# Patient Record
Sex: Female | Born: 1950 | Race: White | Hispanic: No | Marital: Married | Smoking: Never smoker
Health system: Southern US, Community
[De-identification: ages and names within clinical notes are randomized; demographics above are authoritative.]

## PROBLEM LIST (undated history)

## (undated) DIAGNOSIS — Z889 Allergy status to unspecified drugs, medicaments and biological substances status: Secondary | ICD-10-CM

## (undated) DIAGNOSIS — IMO0001 Reserved for inherently not codable concepts without codable children: Secondary | ICD-10-CM

## (undated) DIAGNOSIS — G709 Myoneural disorder, unspecified: Secondary | ICD-10-CM

## (undated) DIAGNOSIS — M1990 Unspecified osteoarthritis, unspecified site: Secondary | ICD-10-CM

## (undated) DIAGNOSIS — F419 Anxiety disorder, unspecified: Secondary | ICD-10-CM

## (undated) DIAGNOSIS — N393 Stress incontinence (female) (male): Secondary | ICD-10-CM

## (undated) DIAGNOSIS — K5900 Constipation, unspecified: Secondary | ICD-10-CM

## (undated) DIAGNOSIS — I8390 Asymptomatic varicose veins of unspecified lower extremity: Secondary | ICD-10-CM

## (undated) DIAGNOSIS — R7989 Other specified abnormal findings of blood chemistry: Secondary | ICD-10-CM

## (undated) HISTORY — PX: TUBAL LIGATION: SHX77

## (undated) HISTORY — PX: KNEE ARTHROSCOPY: SHX127

## (undated) HISTORY — PX: APPENDECTOMY: SHX54

## (undated) HISTORY — PX: ADENOIDECTOMY: SUR15

## (undated) HISTORY — PX: TONSILLECTOMY: SUR1361

---

## 2011-08-14 ENCOUNTER — Encounter: Payer: Self-pay | Admitting: *Deleted

## 2011-08-14 ENCOUNTER — Emergency Department (HOSPITAL_COMMUNITY): Payer: Worker's Compensation

## 2011-08-14 ENCOUNTER — Emergency Department (HOSPITAL_COMMUNITY)
Admission: EM | Admit: 2011-08-14 | Discharge: 2011-08-14 | Disposition: A | Discharge status: Home / Self Care | Payer: Worker's Compensation | Attending: Emergency Medicine | Admitting: Emergency Medicine

## 2011-08-14 DIAGNOSIS — M79609 Pain in unspecified limb: Secondary | ICD-10-CM | POA: Insufficient documentation

## 2011-08-14 DIAGNOSIS — S6000XA Contusion of unspecified finger without damage to nail, initial encounter: Secondary | ICD-10-CM | POA: Insufficient documentation

## 2011-08-14 DIAGNOSIS — S63509A Unspecified sprain of unspecified wrist, initial encounter: Secondary | ICD-10-CM | POA: Insufficient documentation

## 2011-08-14 DIAGNOSIS — T07XXXA Unspecified multiple injuries, initial encounter: Secondary | ICD-10-CM

## 2011-08-14 DIAGNOSIS — M25539 Pain in unspecified wrist: Secondary | ICD-10-CM | POA: Insufficient documentation

## 2011-08-14 DIAGNOSIS — IMO0001 Reserved for inherently not codable concepts without codable children: Secondary | ICD-10-CM | POA: Insufficient documentation

## 2011-08-14 DIAGNOSIS — W19XXXA Unspecified fall, initial encounter: Secondary | ICD-10-CM | POA: Insufficient documentation

## 2011-08-14 MED ORDER — HYDROCODONE-ACETAMINOPHEN 5-325 MG PO TABS: 1.0000 | ORAL_TABLET | ORAL | Status: AC | PRN
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
## 2011-08-14 NOTE — ED Provider Notes (Signed)
Medical screening examination/treatment/procedure(s) were performed by non-physician practitioner and as supervising physician I was immediately available for consultation/collaboration.   Dione Booze, MD 08/14/11 [phone]
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
## 2015-02-21 ENCOUNTER — Encounter (HOSPITAL_COMMUNITY): Payer: Self-pay

## 2015-02-21 ENCOUNTER — Encounter (HOSPITAL_COMMUNITY)
Admission: RE | Admit: 2015-02-21 | Discharge: 2015-02-21 | Disposition: A | Discharge status: Home / Self Care | Payer: Worker's Compensation | Source: Ambulatory Visit | Attending: Orthopedic Surgery | Admitting: Orthopedic Surgery

## 2015-02-21 DIAGNOSIS — Z01812 Encounter for preprocedural laboratory examination: Secondary | ICD-10-CM | POA: Insufficient documentation

## 2015-02-21 DIAGNOSIS — M1712 Unilateral primary osteoarthritis, left knee: Secondary | ICD-10-CM | POA: Insufficient documentation

## 2015-02-21 HISTORY — DX: Allergy status to unspecified drugs, medicaments and biological substances: Z88.9

## 2015-02-21 HISTORY — DX: Myoneural disorder, unspecified: G70.9

## 2015-02-21 LAB — CBC
HCT: 41.1 % (ref 36.0–46.0)
HEMOGLOBIN: 13.5 g/dL (ref 12.0–15.0)
MCH: 29.8 pg (ref 26.0–34.0)
MCHC: 32.8 g/dL (ref 30.0–36.0)
MCV: 90.7 fL (ref 78.0–100.0)
PLATELETS: 272 10*3/uL (ref 150–400)
RBC: 4.53 MIL/uL (ref 3.87–5.11)
RDW: 12.6 % (ref 11.5–15.5)
WBC: 6.9 10*3/uL (ref 4.0–10.5)

## 2015-02-21 LAB — URINALYSIS, ROUTINE W REFLEX MICROSCOPIC
BILIRUBIN URINE: NEGATIVE
GLUCOSE, UA: NEGATIVE mg/dL
Ketones, ur: NEGATIVE mg/dL
NITRITE: NEGATIVE
PH: 6 (ref 5.0–8.0)
Protein, ur: NEGATIVE mg/dL
Specific Gravity, Urine: 1.009 (ref 1.005–1.030)
Urobilinogen, UA: 0.2 mg/dL (ref 0.0–1.0)

## 2015-02-21 LAB — URINE MICROSCOPIC-ADD ON

## 2015-02-21 LAB — BASIC METABOLIC PANEL
Anion gap: 5 (ref 5–15)
BUN: 14 mg/dL (ref 6–20)
CHLORIDE: 107 mmol/L (ref 101–111)
CO2: 28 mmol/L (ref 22–32)
Calcium: 8.9 mg/dL (ref 8.9–10.3)
Creatinine, Ser: 0.62 mg/dL (ref 0.44–1.00)
GFR calc non Af Amer: >60 mL/min (ref >60)
GLUCOSE: 103 mg/dL — AB (ref 65–99)
Potassium: 4.4 mmol/L (ref 3.5–5.1)
Sodium: 140 mmol/L (ref 135–145)

## 2015-02-21 LAB — APTT: APTT: 29 s (ref 24–37)

## 2015-02-21 LAB — SURGICAL PCR SCREEN
MRSA, PCR: NEGATIVE
STAPHYLOCOCCUS AUREUS: NEGATIVE

## 2015-02-21 LAB — PROTIME-INR
INR: 0.95 (ref 0.00–1.49)
PROTHROMBIN TIME: 12.9 s (ref 11.6–15.2)

## 2015-02-21 NOTE — Progress Notes (Signed)
02-21-15 1645 Labs viewable in Epic, note urinalysis.

## 2015-02-21 NOTE — Patient Instructions (Signed)
20 Darlene Petty  02/21/2015   Your procedure is scheduled on:   03-03-2015 Monday  [facility] and [facility]. Arrive at       1000 AM .  (Limit 1 person with you).  Call this number if you have problems the morning of surgery: [phone]  Or Presurgical Testing [phone].   For Living Will and/or Health Care Power Attorney Forms: please provide copy for your medical record,may bring AM of surgery(Forms should be already notarized -we do not provide this service).(NO-  information given 02-21-15 as requested).    Do not eat food/ or drink: After Midnight.  Exception: may have clear liquids:up to 6 Hours before arrival. Nothing after: 0700 AM.  Clear liquids include soda, tea, black coffee, apple or grape juice, broth.  Take these medicines the morning of surgery with A SIP OF WATER: Loratadine(if need).   Do not wear jewelry, make-up or nail polish.  Do not wear deodorant, lotions, powders, or perfumes.   Do not shave legs and under arms- 48 hours(2 days) prior to first CHG shower.(Shaving face and neck okay.)  Do not bring valuables to the hospital.(Hospital is not responsible for lost valuables).  Contacts, dentures or removable bridgework, body piercing, hair pins may not be worn into surgery.  Leave suitcase in the car. After surgery it may be brought to your room.  For patients admitted to the hospital, checkout time is 11:00 AM the day of discharge.(Restricted visitors-Any Persons displaying flu-like symptoms or illness).    Patients discharged the day of surgery will not be allowed to drive home. Must have responsible person with you x 24 hours once discharged.  Name and phone number of your driver: Darlene MinisterDennis- spouse [phone] .      Please read over the following fact sheets that you were given:  CHG(Chlorhexidine Gluconate 4% Surgical Soap) use, MRSA Information, Blood Transfusion fact sheet, Incentive Spirometry  Instruction.  Remember : Type/Screen "Blue armbands" - may not be removed once applied(would result in being retested AM of surgery, if removed).         [place] - Preparing for Surgery Before surgery, you can play an important role.  Because skin is not sterile, your skin needs to be as free of germs as possible.  You can reduce the number of germs on your skin by washing with CHG (chlorahexidine gluconate) soap before surgery.  CHG is an antiseptic cleaner which kills germs and bonds with the skin to continue killing germs even after washing. Please DO NOT use if you have an allergy to CHG or antibacterial soaps.  If your skin becomes reddened/irritated stop using the CHG and inform your nurse when you arrive at Short Stay. Do not shave (including legs and underarms) for at least 48 hours prior to the first CHG shower.  You may shave your face/neck. Please follow these instructions carefully:  1.  Shower with CHG Soap the night before surgery and the  morning of Surgery.  2.  If you choose to wash your hair, wash your hair first as usual with your  normal  shampoo.  3.  After you shampoo, rinse your hair and body thoroughly to remove the  shampoo.                           4.  Use CHG as you would any other liquid soap.  You can apply  chg directly  to the skin and wash                       Gently with a scrungie or clean washcloth.  5.  Apply the CHG Soap to your body ONLY FROM THE NECK DOWN.   Do not use on face/ open                           Wound or open sores. Avoid contact with eyes, ears mouth and genitals (private parts).                       Wash face,  Genitals (private parts) with your normal soap.             6.  Wash thoroughly, paying special attention to the area where your surgery  will be performed.  7.  Thoroughly rinse your body with warm water from the neck down.  8.  DO NOT shower/wash with your normal soap after using and rinsing off  the CHG Soap.                 9.  Pat yourself dry with a clean towel.            10.  Wear clean pajamas.            11.  Place clean sheets on your bed the night of your first shower and do not  sleep with pets. Day of Surgery : Do not apply any lotions/deodorants the morning of surgery.  Please wear clean clothes to the hospital/surgery center.  FAILURE TO FOLLOW THESE INSTRUCTIONS MAY RESULT IN THE CANCELLATION OF YOUR SURGERY PATIENT SIGNATURE_________________________________  NURSE SIGNATURE__________________________________  ________________________________________________________________________   Darlene Petty  An incentive spirometer is a tool that can help keep your lungs clear and active. This tool measures how well you are filling your lungs with each breath. Taking long deep breaths may help reverse or decrease the chance of developing breathing (pulmonary) problems (especially infection) following:  A long period of time when you are unable to move or be active. BEFORE THE PROCEDURE   If the spirometer includes an indicator to show your best effort, your nurse or respiratory therapist will set it to a desired goal.  If possible, sit up straight or lean slightly forward. Try not to slouch.  Hold the incentive spirometer in an upright position. INSTRUCTIONS FOR USE   Sit on the edge of your bed if possible, or sit up as far as you can in bed or on a chair.  Hold the incentive spirometer in an upright position.  Breathe out normally.  Place the mouthpiece in your mouth and seal your lips tightly around it.  Breathe in slowly and as deeply as possible, raising the piston or the ball toward the top of the column.  Hold your breath for 3-5 seconds or for as long as possible. Allow the piston or ball to fall to the bottom of the column.  Remove the mouthpiece from your mouth and breathe out normally.  Rest for a few seconds and repeat Steps 1 through 7 at least 10 times every 1-2 hours  when you are awake. Take your time and take a few normal breaths between deep breaths.  The spirometer may include an indicator to show your best effort. Use the indicator as a goal to work toward during each  repetition.  After each set of 10 deep breaths, practice coughing to be sure your lungs are clear. If you have an incision (the cut made at the time of surgery), support your incision when coughing by placing a pillow or rolled up towels firmly against it. Once you are able to get out of bed, walk around indoors and cough well. You may stop using the incentive spirometer when instructed by your caregiver.  RISKS AND COMPLICATIONS  Take your time so you do not get dizzy or light-headed.  If you are in pain, you may need to take or ask for pain medication before doing incentive spirometry. It is harder to take a deep breath if you are having pain. AFTER USE  Rest and breathe slowly and easily.  It can be helpful to keep track of a log of your progress. Your caregiver can provide you with a simple table to help with this. If you are using the spirometer at home, follow these instructions: [place] IF:   You are having difficultly using the spirometer.  You have trouble using the spirometer as often as instructed.  Your pain medication is not giving enough relief while using the spirometer.  You develop fever of 100.5 F (38.1 C) or higher. SEEK IMMEDIATE MEDICAL CARE IF:   You cough up bloody sputum that had not been present before.  You develop fever of 102 F (38.9 C) or greater.  You develop worsening pain at or near the incision site. MAKE SURE YOU:   Understand these instructions.  Will watch your condition.  Will get help right away if you are not doing well or get worse. Document Released: 12/06/2006 Document Revised: 10/18/2011 Document Reviewed: 02/06/2007 Exit[employer] Patient Information 2014 Exit[employer],  [place].   ________________________________________________________________________  WHAT IS A BLOOD TRANSFUSION? Blood Transfusion Information  A transfusion is the replacement of blood or some of its parts. Blood is made up of multiple cells which provide different functions.  Red blood cells carry oxygen and are used for blood loss replacement.  White blood cells fight against infection.  Platelets control bleeding.  Plasma helps clot blood.  Other blood products are available for specialized needs, such as hemophilia or other clotting disorders. BEFORE THE TRANSFUSION  Who gives blood for transfusions?   Healthy volunteers who are fully evaluated to make sure their blood is safe. This is blood bank blood. Transfusion therapy is the safest it has ever been in the practice of medicine. Before blood is taken from a donor, a complete history is taken to make sure that person has no history of diseases nor engages in risky social behavior (examples are intravenous drug use or sexual activity with multiple partners). The donor's travel history is screened to minimize risk of transmitting infections, such as malaria. The donated blood is tested for signs of infectious diseases, such as HIV and hepatitis. The blood is then tested to be sure it is compatible with you in order to minimize the chance of a transfusion reaction. If you or a relative donates blood, this is often done in anticipation of surgery and is not appropriate for emergency situations. It takes many days to process the donated blood. RISKS AND COMPLICATIONS Although transfusion therapy is very safe and saves many lives, the main dangers of transfusion include:   Getting an infectious disease.  Developing a transfusion reaction. This is an allergic reaction to something in the blood you were given. Every precaution is taken to prevent this. The  decision to have a blood transfusion has been considered carefully by your caregiver  before blood is given. Blood is not given unless the benefits outweigh the risks. AFTER THE TRANSFUSION  Right after receiving a blood transfusion, you will usually feel much better and more energetic. This is especially true if your red blood cells have gotten low (anemic). The transfusion raises the level of the red blood cells which carry oxygen, and this usually causes an energy increase.  The nurse administering the transfusion will monitor you carefully for complications. HOME CARE INSTRUCTIONS  No special instructions are needed after a transfusion. You may find your energy is better. Speak with your caregiver about any limitations on activity for underlying diseases you may have. SEEK MEDICAL CARE IF:   Your condition is not improving after your transfusion.  You develop redness or irritation at the intravenous (IV) site. SEEK IMMEDIATE MEDICAL CARE IF:  Any of the following symptoms occur over the next 12 hours:  Shaking chills.  You have a temperature by mouth above 102 F (38.9 C), not controlled by medicine.  Chest, back, or muscle pain.  People around you feel you are not acting correctly or are confused.  Shortness of breath or difficulty breathing.  Dizziness and fainting.  You get a rash or develop hives.  You have a decrease in urine output.  Your urine turns a dark color or changes to pink, red, or brown. Any of the following symptoms occur over the next 10 days:  You have a temperature by mouth above 102 F (38.9 C), not controlled by medicine.  Shortness of breath.  Weakness after normal activity.  The white part of the eye turns yellow (jaundice).  You have a decrease in the amount of urine or are urinating less often.  Your urine turns a dark color or changes to pink, red, or brown. Document Released: 07/23/2000 Document Revised: 10/18/2011 Document Reviewed: 03/11/2008 [facility] Patient Information 2014 [place],  [place].  _______________________________________________________________________

## 2015-02-21 NOTE — Pre-Procedure Instructions (Signed)
02-21-15 1645 [place], note to office fax [phone] to note urinalysis.

## 2015-02-28 NOTE — H&P (Signed)
TOTAL KNEE ADMISSION H&P  Patient is being admitted for left total knee arthroplasty.  Subjective:  Chief Complaint:   Left knee primary OA / pain.  HPI: Darlene Petty, 64 y.o. female, has a history of pain and functional disability in the left knee due to arthritis and has failed non-surgical conservative treatments for greater than 12 weeks to includeNSAID's and/or analgesics, corticosteriod injections, viscosupplementation injections, use of assistive devices and activity modification.  Onset of symptoms was gradual, starting 10 years ago with gradually worsening course since that time. The patient noted prior procedures on the knee to include  arthroscopy on the left knee(s).  Patient currently rates pain in the left knee(s) at 10 out of 10 with activity. Patient has night pain, worsening of pain with activity and weight bearing, pain that interferes with activities of daily living, pain with passive range of motion, crepitus and joint swelling.  Patient has evidence of periarticular osteophytes and joint space narrowing by imaging studies.  There is no active infection.  Risks, benefits and expectations were discussed with the patient.  Risks including but not limited to the risk of anesthesia, blood clots, nerve damage, blood vessel damage, failure of the prosthesis, infection and up to and including death.  Patient understand the risks, benefits and expectations and wishes to proceed with surgery.   PCP: Pcp Not In System  D/C Plans:      Home with HHPT  Post-op Meds:       No Rx given  Tranexamic Acid:      To be given - IV   Decadron:      Is to be given  FYI:     ASA post-op  Norco post-op    Past Medical History  Diagnosis Date  . Neuromuscular disorder     numbness fingers/hands- bilaterally -right worse than left  . H/O seasonal allergies     Past Surgical History  Procedure Laterality Date  . Tubal ligation    . Appendectomy    . Tonsillectomy    . Knee arthroscopy       left    No prescriptions prior to admission   No Known Allergies   History  Substance Use Topics  . Smoking status: Never Smoker   . Smokeless tobacco: Not on file  . Alcohol Use: No       Review of Systems  Constitutional: Negative.   HENT: Negative.   Eyes: Negative.   Respiratory: Negative.   Cardiovascular: Negative.   Gastrointestinal: Negative.   Genitourinary: Negative.   Musculoskeletal: Positive for joint pain.  Skin: Negative.   Neurological: Negative.   Endo/Heme/Allergies: Negative.   Psychiatric/Behavioral: The patient is nervous/anxious.     Objective:  Physical Exam  Constitutional: She is oriented to person, place, and time. She appears well-developed and well-nourished.  HENT:  Head: Normocephalic and atraumatic.  Eyes: Pupils are equal, round, and reactive to light.  Neck: Neck supple. No JVD present. No tracheal deviation present. No thyromegaly present.  Cardiovascular: Normal rate, regular rhythm, normal heart sounds and intact distal pulses.   Respiratory: Effort normal and breath sounds normal. No stridor. No respiratory distress. She has no wheezes.  GI: Soft. There is no tenderness. There is no guarding.  Musculoskeletal:       Left knee: She exhibits decreased range of motion, swelling, abnormal alignment and bony tenderness. She exhibits no ecchymosis, no deformity, no laceration and no erythema. Tenderness found.  Lymphadenopathy:    She has no cervical  adenopathy.  Neurological: She is alert and oriented to person, place, and time. A sensory deficit (bilateral LE neuropathy) is present.  Skin: Skin is warm and dry.  Psychiatric: She has a normal mood and affect.      Labs:  Estimated body mass index is 30.12 kg/(m^2) as calculated from the following:   Height as of 08/14/11:  (1.6 m).   Weight as of 08/14/11: 77.111 kg (170 lb).   Imaging Review Plain radiographs demonstrate severe degenerative joint disease of the left  knee(s). The overall alignment is mild varus. The bone quality appears to be good for age and reported activity level.  Assessment/Plan:  End stage arthritis, left knee   The patient history, physical examination, clinical judgment of the provider and imaging studies are consistent with end stage degenerative joint disease of the left knee(s) and total knee arthroplasty is deemed medically necessary. The treatment options including medical management, injection therapy arthroscopy and arthroplasty were discussed at length. The risks and benefits of total knee arthroplasty were presented and reviewed. The risks due to aseptic loosening, infection, stiffness, patella tracking problems, thromboembolic complications and other imponderables were discussed. The patient acknowledged the explanation, agreed to proceed with the plan and consent was signed. Patient is being admitted for inpatient treatment for surgery, pain control, PT, OT, prophylactic antibiotics, VTE prophylaxis, progressive ambulation and ADL's and discharge planning. The patient is planning to be discharged home with home health services.      Anastasio Auerbach Shelbee Apgar   PA-C  02/28/2015, 9:00 AM

## 2015-03-03 ENCOUNTER — Inpatient Hospital Stay (HOSPITAL_COMMUNITY): Payer: Worker's Compensation | Admitting: Anesthesiology

## 2015-03-03 ENCOUNTER — Inpatient Hospital Stay (HOSPITAL_COMMUNITY)
Admission: RE | Admit: 2015-03-03 | Discharge: 2015-03-04 | DRG: 470 | Disposition: A | Discharge status: Home / Self Care | Payer: Worker's Compensation | Source: Ambulatory Visit | Attending: Orthopedic Surgery | Admitting: Orthopedic Surgery

## 2015-03-03 ENCOUNTER — Encounter (HOSPITAL_COMMUNITY): Payer: Self-pay | Admitting: *Deleted

## 2015-03-03 ENCOUNTER — Encounter (HOSPITAL_COMMUNITY)
Admission: RE | Disposition: A | Discharge status: Home / Self Care | Payer: Self-pay | Source: Ambulatory Visit | Attending: Orthopedic Surgery

## 2015-03-03 DIAGNOSIS — Z9049 Acquired absence of other specified parts of digestive tract: Secondary | ICD-10-CM | POA: Diagnosis present

## 2015-03-03 DIAGNOSIS — G709 Myoneural disorder, unspecified: Secondary | ICD-10-CM | POA: Diagnosis present

## 2015-03-03 DIAGNOSIS — M659 Synovitis and tenosynovitis, unspecified: Secondary | ICD-10-CM | POA: Diagnosis present

## 2015-03-03 DIAGNOSIS — Z96652 Presence of left artificial knee joint: Secondary | ICD-10-CM

## 2015-03-03 DIAGNOSIS — E669 Obesity, unspecified: Secondary | ICD-10-CM | POA: Diagnosis present

## 2015-03-03 DIAGNOSIS — Z96659 Presence of unspecified artificial knee joint: Secondary | ICD-10-CM

## 2015-03-03 DIAGNOSIS — M1712 Unilateral primary osteoarthritis, left knee: Secondary | ICD-10-CM | POA: Diagnosis present

## 2015-03-03 DIAGNOSIS — Z6830 Body mass index (BMI) 30.0-30.9, adult: Secondary | ICD-10-CM

## 2015-03-03 HISTORY — PX: TOTAL KNEE ARTHROPLASTY: SHX125

## 2015-03-03 LAB — TYPE AND SCREEN
ABO/RH(D): A POS
ANTIBODY SCREEN: NEGATIVE

## 2015-03-03 LAB — ABO/RH: ABO/RH(D): A POS

## 2015-03-03 SURGERY — ARTHROPLASTY, KNEE, TOTAL
Anesthesia: Spinal | Site: Knee | Laterality: Left

## 2015-03-03 MED ORDER — DOCUSATE SODIUM 100 MG PO CAPS
100.0000 mg | ORAL_CAPSULE | Freq: Two times a day (BID) | ORAL | Status: DC
  Administered 2015-03-03 – 2015-03-04 (×2): 100 mg via ORAL

## 2015-03-03 MED ORDER — METOCLOPRAMIDE HCL 5 MG/ML IJ SOLN: 5.0000 mg | Freq: Three times a day (TID) | INTRAMUSCULAR | Status: DC | PRN

## 2015-03-03 MED ORDER — METOCLOPRAMIDE HCL 10 MG PO TABS: 5.0000 mg | ORAL_TABLET | Freq: Three times a day (TID) | ORAL | Status: DC | PRN

## 2015-03-03 MED ORDER — PHENOL 1.4 % MT LIQD
1.0000 | OROMUCOSAL | Status: DC | PRN
  Filled 2015-03-03: qty 177

## 2015-03-03 MED ORDER — KETOROLAC TROMETHAMINE 30 MG/ML IJ SOLN
INTRAMUSCULAR | Status: AC
  Filled 2015-03-03: qty 1

## 2015-03-03 MED ORDER — MAGNESIUM CITRATE PO SOLN: 1.0000 | Freq: Once | ORAL | Status: AC | PRN

## 2015-03-03 MED ORDER — FERROUS SULFATE 325 (65 FE) MG PO TABS
325.0000 mg | ORAL_TABLET | Freq: Three times a day (TID) | ORAL | Status: DC
  Administered 2015-03-03 – 2015-03-04 (×3): 325 mg via ORAL
  Filled 2015-03-03 (×5): qty 1

## 2015-03-03 MED ORDER — ONDANSETRON HCL 4 MG/2ML IJ SOLN
INTRAMUSCULAR | Status: DC | PRN
  Administered 2015-03-03: 4 mg via INTRAVENOUS

## 2015-03-03 MED ORDER — HYDROMORPHONE HCL 1 MG/ML IJ SOLN
0.5000 mg | INTRAMUSCULAR | Status: DC | PRN
  Administered 2015-03-03: 0.5 mg via INTRAVENOUS
  Administered 2015-03-03: 1 mg via INTRAVENOUS
  Administered 2015-03-04: 0.5 mg via INTRAVENOUS
  Filled 2015-03-03 (×3): qty 1

## 2015-03-03 MED ORDER — DIPHENHYDRAMINE HCL 25 MG PO CAPS: 25.0000 mg | ORAL_CAPSULE | Freq: Four times a day (QID) | ORAL | Status: DC | PRN

## 2015-03-03 MED ORDER — PHENYLEPHRINE 40 MCG/ML (10ML) SYRINGE FOR IV PUSH (FOR BLOOD PRESSURE SUPPORT)
PREFILLED_SYRINGE | INTRAVENOUS | Status: AC
  Filled 2015-03-03: qty 10

## 2015-03-03 MED ORDER — PHENYLEPHRINE HCL 10 MG/ML IJ SOLN
INTRAMUSCULAR | Status: AC
  Filled 2015-03-03: qty 1

## 2015-03-03 MED ORDER — PROPOFOL INFUSION 10 MG/ML OPTIME
INTRAVENOUS | Status: DC | PRN
  Administered 2015-03-03: 100 ug/kg/min via INTRAVENOUS

## 2015-03-03 MED ORDER — LACTATED RINGERS IV SOLN
INTRAVENOUS | Status: DC
  Administered 2015-03-03 (×2): via INTRAVENOUS
  Administered 2015-03-03: 1000 mL via INTRAVENOUS

## 2015-03-03 MED ORDER — MIDAZOLAM HCL 2 MG/2ML IJ SOLN
INTRAMUSCULAR | Status: AC
  Filled 2015-03-03: qty 2

## 2015-03-03 MED ORDER — LORATADINE 10 MG PO TABS
10.0000 mg | ORAL_TABLET | Freq: Every day | ORAL | Status: DC | PRN
  Filled 2015-03-03: qty 1

## 2015-03-03 MED ORDER — CEFAZOLIN SODIUM-DEXTROSE 2-3 GM-% IV SOLR
INTRAVENOUS | Status: AC
  Filled 2015-03-03: qty 50

## 2015-03-03 MED ORDER — BISACODYL 10 MG RE SUPP: 10.0000 mg | Freq: Every day | RECTAL | Status: DC | PRN

## 2015-03-03 MED ORDER — FENTANYL CITRATE (PF) 100 MCG/2ML IJ SOLN
INTRAMUSCULAR | Status: AC
  Filled 2015-03-03: qty 2

## 2015-03-03 MED ORDER — CEFAZOLIN SODIUM-DEXTROSE 2-3 GM-% IV SOLR
2.0000 g | Freq: Four times a day (QID) | INTRAVENOUS | Status: AC
  Administered 2015-03-03 – 2015-03-04 (×2): 2 g via INTRAVENOUS
  Filled 2015-03-03 (×2): qty 50

## 2015-03-03 MED ORDER — CELECOXIB 200 MG PO CAPS
200.0000 mg | ORAL_CAPSULE | Freq: Two times a day (BID) | ORAL | Status: DC
  Administered 2015-03-03 – 2015-03-04 (×2): 200 mg via ORAL
  Filled 2015-03-03 (×3): qty 1

## 2015-03-03 MED ORDER — PHENYLEPHRINE HCL 10 MG/ML IJ SOLN
10.0000 mg | INTRAMUSCULAR | Status: DC | PRN
  Administered 2015-03-03: 10 ug/min via INTRAVENOUS
  Administered 2015-03-03: 20 ug/min via INTRAVENOUS

## 2015-03-03 MED ORDER — SODIUM CHLORIDE 0.9 % IV SOLN
INTRAVENOUS | Status: DC
  Administered 2015-03-03: 18:00:00 via INTRAVENOUS
  Filled 2015-03-03 (×4): qty 1000

## 2015-03-03 MED ORDER — HYDROCODONE-ACETAMINOPHEN 7.5-325 MG PO TABS
1.0000 | ORAL_TABLET | ORAL | Status: DC
  Administered 2015-03-03 – 2015-03-04 (×6): 2 via ORAL
  Filled 2015-03-03 (×6): qty 2

## 2015-03-03 MED ORDER — KETOROLAC TROMETHAMINE 30 MG/ML IJ SOLN
INTRAMUSCULAR | Status: DC | PRN
  Administered 2015-03-03: 30 mg

## 2015-03-03 MED ORDER — TRANEXAMIC ACID 1000 MG/10ML IV SOLN
1000.0000 mg | Freq: Once | INTRAVENOUS | Status: AC
  Administered 2015-03-03: 1000 mg via INTRAVENOUS
  Filled 2015-03-03: qty 10

## 2015-03-03 MED ORDER — ACETAMINOPHEN 325 MG PO TABS
ORAL_TABLET | ORAL | Status: AC
  Filled 2015-03-03: qty 2

## 2015-03-03 MED ORDER — PHENYLEPHRINE HCL 10 MG/ML IJ SOLN
INTRAMUSCULAR | Status: DC | PRN
  Administered 2015-03-03 (×7): 80 ug via INTRAVENOUS

## 2015-03-03 MED ORDER — SODIUM CHLORIDE 0.9 % IJ SOLN
INTRAMUSCULAR | Status: DC | PRN
  Administered 2015-03-03: 29 mL

## 2015-03-03 MED ORDER — ONDANSETRON HCL 4 MG PO TABS: 4.0000 mg | ORAL_TABLET | Freq: Four times a day (QID) | ORAL | Status: DC | PRN

## 2015-03-03 MED ORDER — PROPOFOL 10 MG/ML IV BOLUS
INTRAVENOUS | Status: AC
  Filled 2015-03-03: qty 20

## 2015-03-03 MED ORDER — BUPIVACAINE-EPINEPHRINE (PF) 0.25% -1:200000 IJ SOLN
INTRAMUSCULAR | Status: DC | PRN
  Administered 2015-03-03: 30 mL

## 2015-03-03 MED ORDER — METHOCARBAMOL 500 MG PO TABS
500.0000 mg | ORAL_TABLET | Freq: Four times a day (QID) | ORAL | Status: DC | PRN
  Administered 2015-03-04 (×2): 500 mg via ORAL
  Filled 2015-03-03 (×2): qty 1

## 2015-03-03 MED ORDER — BUPIVACAINE-EPINEPHRINE (PF) 0.25% -1:200000 IJ SOLN
INTRAMUSCULAR | Status: AC
  Filled 2015-03-03: qty 30

## 2015-03-03 MED ORDER — HYDROMORPHONE HCL 1 MG/ML IJ SOLN: 0.2500 mg | INTRAMUSCULAR | Status: DC | PRN

## 2015-03-03 MED ORDER — DEXAMETHASONE SODIUM PHOSPHATE 10 MG/ML IJ SOLN
10.0000 mg | Freq: Once | INTRAMUSCULAR | Status: AC
  Administered 2015-03-03: 10 mg via INTRAVENOUS

## 2015-03-03 MED ORDER — MENTHOL 3 MG MT LOZG: 1.0000 | LOZENGE | OROMUCOSAL | Status: DC | PRN

## 2015-03-03 MED ORDER — ASPIRIN EC 325 MG PO TBEC
325.0000 mg | DELAYED_RELEASE_TABLET | Freq: Two times a day (BID) | ORAL | Status: DC
  Administered 2015-03-04: 325 mg via ORAL
  Filled 2015-03-03 (×3): qty 1

## 2015-03-03 MED ORDER — FENTANYL CITRATE (PF) 250 MCG/5ML IJ SOLN
INTRAMUSCULAR | Status: DC | PRN
  Administered 2015-03-03 (×2): 50 ug via INTRAVENOUS

## 2015-03-03 MED ORDER — DEXAMETHASONE SODIUM PHOSPHATE 10 MG/ML IJ SOLN
INTRAMUSCULAR | Status: AC
  Filled 2015-03-03: qty 1

## 2015-03-03 MED ORDER — ONDANSETRON HCL 4 MG/2ML IJ SOLN
INTRAMUSCULAR | Status: AC
  Filled 2015-03-03: qty 2

## 2015-03-03 MED ORDER — DEXAMETHASONE SODIUM PHOSPHATE 10 MG/ML IJ SOLN
10.0000 mg | Freq: Once | INTRAMUSCULAR | Status: DC
  Filled 2015-03-03: qty 1

## 2015-03-03 MED ORDER — ACETAMINOPHEN 325 MG PO TABS
650.0000 mg | ORAL_TABLET | Freq: Once | ORAL | Status: AC
  Administered 2015-03-03: 650 mg via ORAL

## 2015-03-03 MED ORDER — ALUM & MAG HYDROXIDE-SIMETH 200-200-20 MG/5ML PO SUSP: 30.0000 mL | ORAL | Status: DC | PRN

## 2015-03-03 MED ORDER — SODIUM CHLORIDE 0.9 % IJ SOLN
INTRAMUSCULAR | Status: AC
  Filled 2015-03-03: qty 50

## 2015-03-03 MED ORDER — ONDANSETRON HCL 4 MG/2ML IJ SOLN
4.0000 mg | Freq: Four times a day (QID) | INTRAMUSCULAR | Status: DC | PRN
  Administered 2015-03-04: 4 mg via INTRAVENOUS
  Filled 2015-03-03: qty 2

## 2015-03-03 MED ORDER — POLYETHYLENE GLYCOL 3350 17 G PO PACK
17.0000 g | PACK | Freq: Two times a day (BID) | ORAL | Status: DC
  Administered 2015-03-03 – 2015-03-04 (×2): 17 g via ORAL

## 2015-03-03 MED ORDER — CEFAZOLIN SODIUM-DEXTROSE 2-3 GM-% IV SOLR
2.0000 g | INTRAVENOUS | Status: AC
  Administered 2015-03-03: 2 g via INTRAVENOUS

## 2015-03-03 MED ORDER — CHLORHEXIDINE GLUCONATE 4 % EX LIQD: 60.0000 mL | Freq: Once | CUTANEOUS | Status: DC

## 2015-03-03 MED ORDER — METHOCARBAMOL 1000 MG/10ML IJ SOLN
500.0000 mg | Freq: Four times a day (QID) | INTRAVENOUS | Status: DC | PRN
  Filled 2015-03-03: qty 5

## 2015-03-03 MED ORDER — MIDAZOLAM HCL 5 MG/5ML IJ SOLN
INTRAMUSCULAR | Status: DC | PRN
  Administered 2015-03-03 (×2): 1 mg via INTRAVENOUS

## 2015-03-03 MED ORDER — 0.9 % SODIUM CHLORIDE (POUR BTL) OPTIME
TOPICAL | Status: DC | PRN
  Administered 2015-03-03: 1000 mL

## 2015-03-03 MED ORDER — SODIUM CHLORIDE 0.9 % IR SOLN
Status: DC | PRN
  Administered 2015-03-03: 1000 mL

## 2015-03-03 SURGICAL SUPPLY — 56 items
BAG ZIPLOCK 12X15 (MISCELLANEOUS) IMPLANT
BANDAGE ELASTIC 6 VELCRO ST LF (GAUZE/BANDAGES/DRESSINGS) ×3 IMPLANT
BANDAGE ESMARK 6X9 LF (GAUZE/BANDAGES/DRESSINGS) ×1 IMPLANT
BLADE SAW SGTL 13.0X1.19X90.0M (BLADE) ×3 IMPLANT
BNDG ESMARK 6X9 LF (GAUZE/BANDAGES/DRESSINGS) ×3
BOWL SMART MIX CTS (DISPOSABLE) ×3 IMPLANT
CAPT KNEE TOTAL 3 ATTUNE ×3 IMPLANT
CEMENT HV SMART SET (Cement) ×6 IMPLANT
CUFF TOURN SGL QUICK 34 (TOURNIQUET CUFF) ×2
CUFF TRNQT CYL 34X4X40X1 (TOURNIQUET CUFF) ×1 IMPLANT
DECANTER SPIKE VIAL GLASS SM (MISCELLANEOUS) ×3 IMPLANT
DRAPE EXTREMITY T 121X128X90 (DRAPE) ×3 IMPLANT
DRAPE POUCH INSTRU U-SHP 10X18 (DRAPES) ×3 IMPLANT
DRAPE U-SHAPE 47X51 STRL (DRAPES) ×3 IMPLANT
DRSG AQUACEL AG ADV 3.5X10 (GAUZE/BANDAGES/DRESSINGS) ×3 IMPLANT
DURAPREP 26ML APPLICATOR (WOUND CARE) ×6 IMPLANT
ELECT REM PT RETURN 9FT ADLT (ELECTROSURGICAL) ×3
ELECTRODE REM PT RTRN 9FT ADLT (ELECTROSURGICAL) ×1 IMPLANT
FACESHIELD WRAPAROUND (MASK) ×15 IMPLANT
GLOVE BIOGEL PI IND STRL 6.5 (GLOVE) ×2 IMPLANT
GLOVE BIOGEL PI IND STRL 7.5 (GLOVE) ×2 IMPLANT
GLOVE BIOGEL PI IND STRL 8.5 (GLOVE) ×1 IMPLANT
GLOVE BIOGEL PI INDICATOR 6.5 (GLOVE) ×4
GLOVE BIOGEL PI INDICATOR 7.5 (GLOVE) ×4
GLOVE BIOGEL PI INDICATOR 8.5 (GLOVE) ×2
GLOVE ECLIPSE 8.0 STRL XLNG CF (GLOVE) ×3 IMPLANT
GLOVE ORTHO TXT STRL SZ7.5 (GLOVE) ×6 IMPLANT
GLOVE SURG SS PI 7.0 STRL IVOR (GLOVE) ×3 IMPLANT
GLOVE SURG SS PI 7.5 STRL IVOR (GLOVE) ×3 IMPLANT
GOWN SPEC L3 XXLG W/TWL (GOWN DISPOSABLE) ×6 IMPLANT
GOWN STRL REUS W/TWL LRG LVL3 (GOWN DISPOSABLE) ×3 IMPLANT
GOWN STRL REUS W/TWL XL LVL3 (GOWN DISPOSABLE) ×3 IMPLANT
HANDPIECE INTERPULSE COAX TIP (DISPOSABLE) ×2
KIT BASIN OR (CUSTOM PROCEDURE TRAY) ×3 IMPLANT
LIQUID BAND (GAUZE/BANDAGES/DRESSINGS) ×3 IMPLANT
MANIFOLD NEPTUNE II (INSTRUMENTS) ×3 IMPLANT
NDL SAFETY ECLIPSE 18X1.5 (NEEDLE) ×1 IMPLANT
NEEDLE HYPO 18GX1.5 SHARP (NEEDLE) ×2
PACK TOTAL JOINT (CUSTOM PROCEDURE TRAY) ×3 IMPLANT
PEN SKIN MARKING BROAD (MISCELLANEOUS) ×3 IMPLANT
POSITIONER SURGICAL ARM (MISCELLANEOUS) ×3 IMPLANT
SET HNDPC FAN SPRY TIP SCT (DISPOSABLE) ×1 IMPLANT
SET PAD KNEE POSITIONER (MISCELLANEOUS) ×3 IMPLANT
SUCTION FRAZIER 12FR DISP (SUCTIONS) ×3 IMPLANT
SUT MNCRL AB 4-0 PS2 18 (SUTURE) ×3 IMPLANT
SUT VIC AB 1 CT1 36 (SUTURE) ×3 IMPLANT
SUT VIC AB 2-0 CT1 27 (SUTURE) ×6
SUT VIC AB 2-0 CT1 TAPERPNT 27 (SUTURE) ×3 IMPLANT
SUT VLOC 180 0 24IN GS25 (SUTURE) ×3 IMPLANT
SYR 50ML LL SCALE MARK (SYRINGE) ×3 IMPLANT
TOWEL OR 17X26 10 PK STRL BLUE (TOWEL DISPOSABLE) ×3 IMPLANT
TOWEL OR NON WOVEN STRL DISP B (DISPOSABLE) ×3 IMPLANT
TRAY FOLEY W/METER SILVER 14FR (SET/KITS/TRAYS/PACK) ×3 IMPLANT
WATER STERILE IRR 1500ML POUR (IV SOLUTION) ×3 IMPLANT
WRAP KNEE MAXI GEL POST OP (GAUZE/BANDAGES/DRESSINGS) ×3 IMPLANT
YANKAUER SUCT BULB TIP 10FT TU (MISCELLANEOUS) ×3 IMPLANT

## 2015-03-03 NOTE — Anesthesia Postprocedure Evaluation (Signed)
  Anesthesia Post-op Note  Patient: Darlene Petty  Procedure(s) Performed: Procedure(s) (LRB): LEFT TOTAL KNEE ARTHROPLASTY (Left)  Patient Location: PACU  Anesthesia Type: Spinal  Level of Consciousness: awake and alert   Airway and Oxygen Therapy: Patient Spontanous Breathing  Post-op Pain: mild  Post-op Assessment: Post-op Vital signs reviewed, Patient's Cardiovascular Status Stable, Respiratory Function Stable, Patent Airway and No signs of Nausea or vomiting. Spinal is receding.  Last Vitals:  Filed Vitals:   03/03/15 1643  BP: 104/52  Pulse: 81  Temp: 37.1 C  Resp: 16    Post-op Vital Signs: stable   Complications: No apparent anesthesia complications

## 2015-03-03 NOTE — Anesthesia Preprocedure Evaluation (Addendum)
Anesthesia Evaluation  Patient identified by MRN, date of birth, ID band Patient awake    Reviewed: Allergy & Precautions, NPO status , Patient's Chart, lab work & pertinent test results  Airway Mallampati: II  TM Distance: >3 FB Neck ROM: Full    Dental no notable dental hx.    Pulmonary neg pulmonary ROS,  breath sounds clear to auscultation  Pulmonary exam normal       Cardiovascular negative cardio ROS Normal cardiovascular examRhythm:Regular Rate:Normal     Neuro/Psych Right foot numbness  Neuromuscular disease negative psych ROS   GI/Hepatic negative GI ROS, Neg liver ROS,   Endo/Other  negative endocrine ROS  Renal/GU negative Renal ROS  negative genitourinary   Musculoskeletal negative musculoskeletal ROS (+)   Abdominal   Peds negative pediatric ROS (+)  Hematology negative hematology ROS (+)   Anesthesia Other Findings   Reproductive/Obstetrics negative OB ROS                            Anesthesia Physical Anesthesia Plan  ASA: II  Anesthesia Plan: Spinal   Post-op Pain Management:    Induction: Intravenous  Airway Management Planned: Natural Airway  Additional Equipment:   Intra-op Plan:   Post-operative Plan:   Informed Consent: I have reviewed the patients History and Physical, chart, labs and discussed the procedure including the risks, benefits and alternatives for the proposed anesthesia with the patient or authorized representative who has indicated his/her understanding and acceptance.   Dental advisory given  Plan Discussed with: CRNA  Anesthesia Plan Comments: (Discussed risks and benefits of and differences between spinal and general. Discussed risks of spinal including headache, backache, failure, bleeding and hematoma, infection, and nerve damage and paralysis. Patient consents to spinal. Questions answered. Coagulation studies and platelet count  acceptable. )       Anesthesia Quick Evaluation

## 2015-03-03 NOTE — Op Note (Signed)
NAME:  Darlene Petty                      MEDICAL RECORD NO.:  [id number]                             FACILITY:  [facility]      PHYSICIAN:  Madlyn Frankel. Charlann Boxer, M.D.  DATE OF BIRTH:  December 01, 1950      DATE OF PROCEDURE:  03/03/2015                                     OPERATIVE REPORT         PREOPERATIVE DIAGNOSIS:  Left knee osteoarthritis.      POSTOPERATIVE DIAGNOSIS:  Left knee osteoarthritis.      FINDINGS:  The patient was noted to have complete loss of cartilage and   bone-on-bone arthritis with associated osteophytes in the medial and patellofemoral compartments of   the knee with a significant synovitis and associated effusion.      PROCEDURE:  Left total knee replacement.      COMPONENTS USED:  DePuy Attune rotating platform posterior stabilized knee   system, a size 5 femur, 5 tibia, size 7 mm PS AOX insert, and 38 patellar   button.      SURGEON:  Madlyn Frankel. Charlann Boxer, M.D.      ASSISTANT:  Lanney Gins, PA-C.      ANESTHESIA:  Spinal.      SPECIMENS:  None.      COMPLICATION:  None.      DRAINS:  One Hemovac.  EBL: <50cc      TOURNIQUET TIME:   Total Tourniquet Time Documented: Thigh (Left) - 33 minutes Total: Thigh (Left) - 33 minutes  .      The patient was stable to the recovery room.      INDICATION FOR PROCEDURE:  Darlene Petty is a 64 y.o. female patient of   mine.  The patient had been seen, evaluated, and treated conservatively in the   office with medication, activity modification, and injections.  The patient had   radiographic changes of bone-on-bone arthritis with endplate sclerosis and osteophytes noted.      The patient failed conservative measures including medication, injections, and activity modification, and at this point was ready for more definitive measures.   Based on the radiographic changes and failed conservative measures, the patient   decided to proceed with total knee replacement.  Risks of infection,   DVT, component failure, need  for revision surgery, postop course, and   expectations were all   discussed and reviewed.  Consent was obtained for benefit of pain   relief.      PROCEDURE IN DETAIL:  The patient was brought to the operative theater.   Once adequate anesthesia, preoperative antibiotics, 2 gm of Ancef, 1gm of Tranexamic Acid, and 10mg  of Decadron administered, the patient was positioned supine with the left thigh tourniquet placed.  The  left lower extremity was prepped and draped in sterile fashion.  A time-   out was performed identifying the patient, planned procedure, and   extremity.      The left lower extremity was placed in the [facility] leg holder.  The leg was   exsanguinated, tourniquet elevated to 250 mmHg.  A midline incision was   made followed by median parapatellar  arthrotomy.  Following initial   exposure, attention was first directed to the patella.  Precut   measurement was noted to be 24 mm.  I resected down to 14 mm and used a   38 patellar button to restore patellar height as well as cover the cut   surface.      The lug holes were drilled and a metal shim was placed to protect the   patella from retractors and saw blades.      At this point, attention was now directed to the femur.  The femoral   canal was opened with a drill, irrigated to try to prevent fat emboli.  An   intramedullary rod was passed at 3 degrees valgus, 9 mm of bone was   resected off the distal femur.  Following this resection, the tibia was   subluxated anteriorly.  Using the extramedullary guide, 2 mm of bone was resected off   the proximal medial tibia.  We confirmed the gap would be   stable medially and laterally with a size 6 mm insert as well as confirmed   the cut was perpendicular in the coronal plane, checking with an alignment rod.      Once this was done, I sized the femur to be a size 5 in the anterior-   posterior dimension, chose a standard component based on medial and   lateral dimension.   The size 5 rotation block was then pinned in   position anterior referenced using the C-clamp to set rotation.  The   anterior, posterior, and  chamfer cuts were made without difficulty nor   notching making certain that I was along the anterior cortex to help   with flexion gap stability.      The final box cut was made off the lateral aspect of distal femur.      At this point, the tibia was sized to be a size 5, the size 5 tray was   then pinned in position through the medial third of the tubercle,   drilled, and keel punched.  Trial reduction was now carried with a 5 femur,  5 tibia, a size 6 then 7 mm insert, and the 38 patella botton.  The knee was brought to   extension, full extension with good flexion stability with the patella   tracking through the trochlea without application of pressure.  Given   all these findings, the trial components removed.  Final components were   opened and cement was mixed.  The knee was irrigated with normal saline   solution and pulse lavage.  The synovial lining was   then injected with 30cc of 0.25% Marcaine with epinephrine and 1 cc of Toradol plus 30cc of NS for a total of 61 cc.      The knee was irrigated.  Final implants were then cemented onto clean and   dried cut surfaces of bone with the knee brought to extension with a size 7   mm trial insert.      Once the cement had fully cured, the excess cement was removed   throughout the knee.  I confirmed I was satisfied with the range of   motion and stability, and the final size 7 mm PS AOX insert was chosen.  It was   placed into the knee.      The tourniquet had been let down at 33 minutes.  No significant   hemostasis required.  The   extensor mechanism  was then reapproximated using #1 Vicryl and #0 V-lock sutures with the knee   in flexion.  The   remaining wound was closed with 2-0 Vicryl and running 4-0 Monocryl.   The knee was cleaned, dried, dressed sterilely using Dermabond and    Aquacel dressing.  The patient was then   brought to recovery room in stable condition, tolerating the procedure   well.   Please note that Physician Assistant, Lanney Gins, PA-C, was present for the entirety of the case, and was utilized for pre-operative positioning, peri-operative retractor management, general facilitation of the procedure.  He was also utilized for primary wound closure at the end of the case.              Madlyn Frankel Charlann Boxer, M.D.    03/03/2015 3:29 PM

## 2015-03-03 NOTE — Interval H&P Note (Signed)
History and Physical Interval Note:  03/03/2015 12:59 PM  Darlene Petty  has presented today for surgery, with the diagnosis of progressive left knee osteoarthritis  The various methods of treatment have been discussed with the patient and family. After consideration of risks, benefits and other options for treatment, the patient has consented to  Procedure(s): LEFT TOTAL KNEE ARTHROPLASTY (Left) as a surgical intervention .  The patient's history has been reviewed, patient examined, no change in status, stable for surgery.  I have reviewed the patient's chart and labs.  Questions were answered to the patient's satisfaction.     [name]

## 2015-03-03 NOTE — Transfer of Care (Signed)
Immediate Anesthesia Transfer of Care Note  Patient: Darlene Petty  Procedure(s) Performed: Procedure(s): LEFT TOTAL KNEE ARTHROPLASTY (Left)  Patient Location: PACU  Anesthesia Type:MAC and Spinal  Level of Consciousness: awake, alert , oriented and patient cooperative  Airway & Oxygen Therapy: Patient Spontanous Breathing and Patient connected to face mask oxygen  Post-op Assessment: Report given to RN and Post -op Vital signs reviewed and stable  Post vital signs: Reviewed and stable  Last Vitals:  Filed Vitals:   03/03/15 0957  BP: 121/60  Pulse: 75  Temp: 36.4 C  Resp: 18    Complications: No apparent anesthesia complications

## 2015-03-03 NOTE — Anesthesia Procedure Notes (Addendum)
Spinal Patient location during procedure: OR Staffing Performed by: anesthesiologist  Preanesthetic Checklist Completed: patient identified, site marked, surgical consent, pre-op evaluation, timeout performed, IV checked, risks and benefits discussed and monitors and equipment checked Spinal Block Patient position: sitting Prep: Betadine Patient monitoring: heart rate, continuous pulse ox and blood pressure Approach: midline Location: L3-4 Injection technique: single-shot Needle Needle type: Sprotte  Needle gauge: 24 G Needle length: 9 cm Additional Notes Expiration date of kit checked and confirmed. Patient tolerated procedure well, without complications. CSF clear. No paresthesias. Meaningful verbal contact maintained throughout spinal placement.    Procedure Name: MAC Date/Time: 03/03/2015 1:15 PM Performed by: Dione Booze Pre-anesthesia Checklist: Patient identified, Emergency Drugs available, Suction available and Patient being monitored Patient Re-evaluated:Patient Re-evaluated prior to inductionOxygen Delivery Method: Simple face mask Placement Confirmation: positive ETCO2

## 2015-03-04 ENCOUNTER — Encounter (HOSPITAL_COMMUNITY): Payer: Self-pay | Admitting: Orthopedic Surgery

## 2015-03-04 DIAGNOSIS — E669 Obesity, unspecified: Secondary | ICD-10-CM | POA: Diagnosis present

## 2015-03-04 LAB — BASIC METABOLIC PANEL
ANION GAP: 6 (ref 5–15)
BUN: 13 mg/dL (ref 6–20)
CALCIUM: 8.6 mg/dL — AB (ref 8.9–10.3)
CO2: 25 mmol/L (ref 22–32)
Chloride: 105 mmol/L (ref 101–111)
Creatinine, Ser: 0.71 mg/dL (ref 0.44–1.00)
GFR calc Af Amer: >60 mL/min (ref >60)
Glucose, Bld: 149 mg/dL — ABNORMAL HIGH (ref 65–99)
POTASSIUM: 4.9 mmol/L (ref 3.5–5.1)
Sodium: 136 mmol/L (ref 135–145)

## 2015-03-04 LAB — CBC
HEMATOCRIT: 34.1 % — AB (ref 36.0–46.0)
Hemoglobin: 11.3 g/dL — ABNORMAL LOW (ref 12.0–15.0)
MCH: 29.9 pg (ref 26.0–34.0)
MCHC: 33.1 g/dL (ref 30.0–36.0)
MCV: 90.2 fL (ref 78.0–100.0)
Platelets: 253 10*3/uL (ref 150–400)
RBC: 3.78 MIL/uL — ABNORMAL LOW (ref 3.87–5.11)
RDW: 12.4 % (ref 11.5–15.5)
WBC: 17.7 10*3/uL — ABNORMAL HIGH (ref 4.0–10.5)

## 2015-03-04 MED ORDER — POLYETHYLENE GLYCOL 3350 17 G PO PACK: 17.0000 g | PACK | Freq: Two times a day (BID) | ORAL | Status: DC

## 2015-03-04 MED ORDER — DOCUSATE SODIUM 100 MG PO CAPS: 100.0000 mg | ORAL_CAPSULE | Freq: Two times a day (BID) | ORAL | Status: DC

## 2015-03-04 MED ORDER — METHOCARBAMOL 500 MG PO TABS
500.0000 mg | ORAL_TABLET | Freq: Four times a day (QID) | ORAL | Status: DC | PRN
Start: 2015-03-04 — End: 2015-11-19

## 2015-03-04 MED ORDER — FERROUS SULFATE 325 (65 FE) MG PO TABS: 325.0000 mg | ORAL_TABLET | Freq: Three times a day (TID) | ORAL | Status: DC

## 2015-03-04 MED ORDER — ASPIRIN 325 MG PO TBEC: 325.0000 mg | DELAYED_RELEASE_TABLET | Freq: Two times a day (BID) | ORAL | Status: AC

## 2015-03-04 MED ORDER — HYDROCODONE-ACETAMINOPHEN 7.5-325 MG PO TABS: 1.0000 | ORAL_TABLET | ORAL | Status: DC | PRN

## 2015-03-04 NOTE — Progress Notes (Signed)
     Subjective: 1 Day Post-Op Procedure(s) (LRB): LEFT TOTAL KNEE ARTHROPLASTY (Left)   Seen by Dr. Charlann Boxer. Patient reports pain as mild, pain controlled. No events throughout the night.  Ready to be discharged home if she does well with PT and pain stays controlled.  Objective:   VITALS:   Filed Vitals:   03/04/15 0600  BP: 107/80  Pulse: 74  Temp: 98.3 F (36.8 C)  Resp: 16    Dorsiflexion/Plantar flexion intact Incision: dressing C/D/I No cellulitis present Compartment soft  LABS  Recent Labs  03/04/15 0510  HGB 11.3*  HCT 34.1*  WBC 17.7*  PLT 253     Recent Labs  03/04/15 0510  NA 136  K 4.9  BUN 13  CREATININE 0.71  GLUCOSE 149*     Assessment/Plan: 1 Day Post-Op Procedure(s) (LRB): LEFT TOTAL KNEE ARTHROPLASTY (Left) Foley cath d/c'ed Advance diet Up with therapy D/C IV fluids Discharge home with home health  Follow up in 2 weeks at [facility]. Follow up with OLIN,Dolly Harbach D in 2 weeks.  Contact information:  [facility] [address] [place] [address] [phone]    Obese (BMI 30-39.9) Estimated body mass index is 30.04 kg/(m^2) as calculated from the following:   Height as of this encounter:  (1.6 m).   Weight as of this encounter: 76.913 kg (169 lb 9 oz). Patient also counseled that weight may inhibit the healing process Patient counseled that losing weight will help with future health issues       Anastasio Auerbach. Arfa Lamarca   PAC  03/04/2015, 9:31 AM

## 2015-03-04 NOTE — Care Management Note (Addendum)
Case Management Note  Patient Details  Name: Darlene Petty MRN: [id number] Date of Birth: Dec 12, 1950  Subjective/Objective:                   LEFT TOTAL KNEE ARTHROPLASTY (Left) Action/Plan:  Discharge planning Expected Discharge Date:  03/05/15               Expected Discharge Plan:  [place]  In-House Referral:     Discharge planning Services  CM Consult  Post Acute Care Choice:    Choice offered to:     DME Arranged:    DME Agency:     HH Arranged:  PT HH Agency:     Status of Service:  In process, will continue to follow  Medicare Important Message Given:    Date Medicare IM Given:    Medicare IM give by:    Date Additional Medicare IM Given:    Additional Medicare Important Message give by:     If discussed at [place] of Stay Meetings, dates discussed:    Additional Comments: 12:24 CM received callback from Lear Ng with her correct number [phone] and Nira Conn states she has arranged for outpt rehab for pt, has arranged for CPM to be delivered to pt's home; and needs order for 3n1 to be faxed to [phone] and she will  Have the 3n1 delivered to the pt's home.  CM faxed the requested order and notified pt of Heather's arrangements.  No other CM needs were communicated. CM met with pt who gave CM Heather Pressley [phone] as contact for WC.  CM called this number and message of disconnection; CM called Dr. Aurea Graff office [phone] and office gave me same contact information.  CM called [facility] main number [phone] and was given Jake Church [phone] as contact.  CM called Jake Church and received confidential VM and CM left number to please call me back.  CM waiting for callback. Dellie Catholic, RN 03/04/2015, 11:36 AM

## 2015-03-04 NOTE — Progress Notes (Signed)
Utilization review completed.  

## 2015-03-04 NOTE — Progress Notes (Signed)
   03/04/15 1400  PT Visit Information  Last PT Received On 03/04/15  Assistance Needed +1  History of Present Illness s/p L TKA  PT Time Calculation  PT Start Time (ACUTE ONLY) 1341  PT Stop Time (ACUTE ONLY) 1359  PT Time Calculation (min) (ACUTE ONLY) 18 min  Subjective Data  Subjective pt tearful  Patient Stated Goal return to work, knee healed  Precautions  Precautions Knee  Restrictions  Weight Bearing Restrictions No  Other Position/Activity Restrictions WBAT  Pain Assessment  Pain Assessment Faces  Faces Pain Scale 4  Pain Location L knee and thigh  Pain Descriptors / Indicators Sore  Pain Intervention(s) Limited activity within patient's tolerance;Monitored during session;Premedicated before session;Ice applied  Cognition  Arousal/Alertness Awake/alert  Behavior During Therapy WFL for tasks assessed/performed  Overall Cognitive Status Within Functional Limits for tasks assessed  Bed Mobility  Supine to sit Supervision  Transfers  Overall transfer level Needs assistance  Equipment used Rolling walker (2 wheeled)  Transfers Sit to/from Stand  Sit to Stand Min guard  General transfer comment cues for UE/LE placement  Ambulation/Gait  Ambulation/Gait assistance Min guard;Min assist  Ambulation Distance (Feet) 35 Feet  Assistive device Rolling walker (2 wheeled)  Gait Pattern/deviations Step-to pattern;Antalgic;Trunk flexed  General Gait Details cues for RW safety/position from self, sequence and posture  Stairs Yes  Stairs assistance Min assist  Stair Management No rails;Backwards;With walker  Number of Stairs 1  General stair comments cues for sequence and safe technique  PT - End of Session  Equipment Utilized During Treatment Gait belt  Activity Tolerance Patient tolerated treatment well  Patient left Other (comment);with nursing/sitter in room;with family/visitor present (w/c)  Nurse Communication Mobility status  PT - Assessment/Plan  PT Plan Current  plan remains appropriate  PT Frequency (ACUTE ONLY) 7X/week  Follow Up Recommendations Home health PT;Supervision for mobility/OOB  PT equipment None recommended by PT  PT Goal Progression  Progress towards PT goals Progressing toward goals  Acute Rehab PT Goals  PT Goal Formulation With patient  Time For Goal Achievement 03/11/15  Potential to Achieve Goals Good  PT General Charges  $$ ACUTE PT VISIT 1 Procedure  PT Treatments  $Gait Training 8-22 mins

## 2015-03-04 NOTE — Discharge Instructions (Signed)

## 2015-03-04 NOTE — Evaluation (Signed)
Physical Therapy Evaluation Patient Details Name: Darlene Petty MRN: [id number] DOB: [birth year] Today's Date: 03/04/2015   History of Present Illness  s/p L TKA  Clinical Impression  Pt admitted with above diagnosis. Pt currently with functional limitations due to the deficits listed below (see PT Problem List).  Pt will benefit from skilled PT to increase their independence and safety with mobility to allow discharge to the venue listed below.       Follow Up Recommendations Home health PT    Equipment Recommendations  None recommended by PT    Recommendations for Other Services       Precautions / Restrictions Precautions Precautions: Knee Precaution Comments: WBAT      Mobility  Bed Mobility Overal bed mobility: Needs Assistance Bed Mobility: Supine to Sit     Supine to sit: Supervision     General bed mobility comments: incr time, pt uses RLE to assist LLE  Transfers Overall transfer level: Needs assistance Equipment used: Rolling walker (2 wheeled) Transfers: Sit to/from Stand Sit to Stand: Min assist         General transfer comment: cues for hand placement and safety  Ambulation/Gait Ambulation/Gait assistance: Min assist Ambulation Distance (Feet): 25 Feet (15' x 2) Assistive device: Rolling walker (2 wheeled) Gait Pattern/deviations: Step-to pattern;Step-through pattern;Antalgic;Trunk flexed     General Gait Details: cues for RW safety, sequence and posture  Stairs            Wheelchair Mobility    Modified Rankin (Stroke Patients Only)       Balance Overall balance assessment: Needs assistance           Standing balance-Leahy Scale: Poor Standing balance comment: requires unilateral UE support for balance                             Pertinent Vitals/Pain Pain Assessment: 0-10 Pain Score: 4  Pain Location: L knee Pain Descriptors / Indicators: Discomfort Pain Intervention(s): Limited activity within patient's  tolerance;Premedicated before session;Monitored during session;Repositioned;Ice applied    Home Living Family/patient expects to be discharged to:: Private residence Living Arrangements: Spouse/significant other   Type of Home: House Home Access: Stairs to enter Entrance Stairs-Rails: None Entrance Stairs-Number of Steps: 1  ( 8" step) Home Layout: One level;Laundry or work area in [employer] Equipment: Environmental consultant - 2 wheels;Cane - single point Additional Comments: pt works as a Engineer, civil (consulting) at [employer] Level of Independence: Independent with assistive device(s)         Comments: cane or walker occasionally     Hand Dominance        Extremity/Trunk Assessment               Lower Extremity Assessment: LLE deficits/detail   LLE Deficits / Details: knee extension and hip flexion 2+/5; ankle WFL; knee AAROM  8 to 90* flexion     Communication   Communication: No difficulties  Cognition Arousal/Alertness: Awake/alert Behavior During Therapy: WFL for tasks assessed/performed Overall Cognitive Status: Within Functional Limits for tasks assessed                      General Comments      Exercises Total Joint Exercises Ankle Circles/Pumps: AROM;Both;10 reps Quad Sets: 10 reps;Both;AROM Heel Slides: AROM;AAROM;Left;10 reps Straight Leg Raises: AAROM;Left;10 reps Knee Flexion: AROM;5 reps;Left      Assessment/Plan    PT Assessment Patient needs  continued PT services  PT Diagnosis Difficulty walking   PT Problem List Decreased strength;Decreased range of motion;Decreased activity tolerance;Decreased balance;Decreased mobility;Decreased safety awareness;Decreased knowledge of use of DME;Pain  PT Treatment Interventions DME instruction;Gait training;Stair training;Functional mobility training;Therapeutic activities;Patient/family education;Therapeutic exercise   PT Goals (Current goals can be found in the Care Plan section) Acute Rehab PT  Goals Patient Stated Goal: return to work, knee healed PT Goal Formulation: With patient Time For Goal Achievement: 03/11/15 Potential to Achieve Goals: Good    Frequency 7X/week   Barriers to discharge        Co-evaluation               End of Session Equipment Utilized During Treatment: Gait belt Activity Tolerance: Patient tolerated treatment well Patient left: in chair;with call bell/phone within reach;with family/visitor present Nurse Communication: Mobility status         Time: 2130-8657 PT Time Calculation (min) (ACUTE ONLY): 36 min   Charges:   PT Evaluation $Initial PT Evaluation Tier I: 1 Procedure PT Treatments $Gait Training: 8-22 mins   PT G Codes:        Anorah Trias 2015-03-17, 9:59 AM

## 2015-03-04 NOTE — Discharge Summary (Signed)
Physician Discharge Summary  Patient ID: Darlene Petty MRN: [id number] DOB/AGE: Feb 19, 1951 64 y.o.  Admit date: 03/03/2015 Discharge date: 03/04/2015   Procedures:  Procedure(s) (LRB): LEFT TOTAL KNEE ARTHROPLASTY (Left)  Attending Physician:  Dr. Durene Romans   Admission Diagnoses:   Left knee primary OA / pain   Discharge Diagnoses:  Principal Problem:   S/P left TKA Active Problems:   Obese  Past Medical History  Diagnosis Date  . Neuromuscular disorder     numbness fingers/hands- bilaterally -right worse than left  . H/O seasonal allergies     HPI:    Darlene Petty, 64 y.o. female, has a history of pain and functional disability in the left knee due to arthritis and has failed non-surgical conservative treatments for greater than 12 weeks to includeNSAID's and/or analgesics, corticosteriod injections, viscosupplementation injections, use of assistive devices and activity modification. Onset of symptoms was gradual, starting 10 years ago with gradually worsening course since that time. The patient noted prior procedures on the knee to include arthroscopy on the left knee(s). Patient currently rates pain in the left knee(s) at 10 out of 10 with activity. Patient has night pain, worsening of pain with activity and weight bearing, pain that interferes with activities of daily living, pain with passive range of motion, crepitus and joint swelling. Patient has evidence of periarticular osteophytes and joint space narrowing by imaging studies. There is no active infection. Risks, benefits and expectations were discussed with the patient. Risks including but not limited to the risk of anesthesia, blood clots, nerve damage, blood vessel damage, failure of the prosthesis, infection and up to and including death. Patient understand the risks, benefits and expectations and wishes to proceed with surgery.   PCP: Pcp Not In System   Discharged Condition: good  Hospital Course:   Patient underwent the above stated procedure on 03/03/2015. Patient tolerated the procedure well and brought to the recovery room in good condition and subsequently to the floor.  POD #1 BP: 107/80 ; Pulse: 74 ; Temp: 98.3 F (36.8 C) ; Resp: 16 Patient reports pain as mild, pain controlled. No events throughout the night. Ready to be discharged home. Dorsiflexion/plantar flexion intact, incision: dressing C/D/I, no cellulitis present and compartment soft.   LABS  Basename    HGB  11.3  HCT  34.1    Discharge Exam: General appearance: alert, cooperative and no distress Extremities: Homans sign is negative, no sign of DVT, no edema, redness or tenderness in the calves or thighs and no ulcers, gangrene or trophic changes  Disposition: Home with follow up in 2 weeks   Follow-up Information    Follow up with [name], MD. Schedule an appointment as soon as possible for a visit in 2 weeks.   Specialty:  Orthopedic Surgery   Contact information:   [address] [place] [address] [phone]       Discharge Instructions    Call MD / Call 911    Complete by:  As directed   If you experience chest pain or shortness of breath, CALL 911 and be transported to the hospital emergency room.  If you develope a fever above 101 F, pus (white drainage) or increased drainage or redness at the wound, or calf pain, call your surgeon's office.     Change dressing    Complete by:  As directed   Maintain surgical dressing until follow up in the clinic. If the edges start to pull up, may reinforce with  tape. If the dressing is no longer working, may remove and cover with gauze and tape, but must keep the area dry and clean.  Call with any questions or concerns.     Constipation Prevention    Complete by:  As directed   Drink plenty of fluids.  Prune juice may be helpful.  You may use a stool softener, such as Colace (over the counter) 100 mg twice a day.  Use MiraLax (over  the counter) for constipation as needed.     Diet - low sodium heart healthy    Complete by:  As directed      Discharge instructions    Complete by:  As directed   Maintain surgical dressing until follow up in the clinic. If the edges start to pull up, may reinforce with tape. If the dressing is no longer working, may remove and cover with gauze and tape, but must keep the area dry and clean.  Follow up in 2 [facility] at [facility]. Call with any questions or concerns.     Increase activity slowly as tolerated    Complete by:  As directed      TED hose    Complete by:  As directed   Use stockings (TED hose) for 2 weeks on both leg(s).  You may remove them at night for sleeping.     Weight bearing as tolerated    Complete by:  As directed   Laterality:  left  Extremity:  Lower             Medication List    STOP taking these medications        diphenhydramine-acetaminophen 25-500 MG Tabs  Commonly known as:  TYLENOL PM     naproxen sodium 220 MG tablet  Commonly known as:  ANAPROX     sulfamethoxazole-trimethoprim 800-160 MG per tablet  Commonly known as:  BACTRIM DS,SEPTRA DS      TAKE these medications        aspirin 325 MG EC tablet  Take 1 tablet (325 mg total) by mouth 2 (two) times daily.     docusate sodium 100 MG capsule  Commonly known as:  COLACE  Take 1 capsule (100 mg total) by mouth 2 (two) times daily.     ferrous sulfate 325 (65 FE) MG tablet  Take 1 tablet (325 mg total) by mouth 3 (three) times daily after meals.     HYDROcodone-acetaminophen 7.5-325 MG per tablet  Commonly known as:  NORCO  Take 1-2 tablets by mouth every 4 (four) hours as needed for moderate pain.     loratadine 10 MG tablet  Commonly known as:  CLARITIN  Take 10 mg by mouth daily as needed for allergies.     methocarbamol 500 MG tablet  Commonly known as:  ROBAXIN  Take 1 tablet (500 mg total) by mouth every 6 (six) hours as needed for muscle spasms.      polyethylene glycol packet  Commonly known as:  MIRALAX / GLYCOLAX  Take 17 g by mouth 2 (two) times daily.         Signed: Anastasio Auerbach. Ellayna Hilligoss   PA-C  03/04/2015, 3:24 PM

## 2015-03-04 NOTE — Evaluation (Signed)
Occupational Therapy Evaluation Patient Details Name: Darlene Petty MRN: [id number] DOB: April 07, 1951 Today's Date: 03/04/2015    History of Present Illness s/p L TKA   Clinical Impression   This 64 year old female was admitted for the above surgery.  All education was completed.  No further OT is needed at this time    Follow Up Recommendations  No OT follow up    Equipment Recommendations  3 in 1 bedside comode    Recommendations for Other Services       Precautions / Restrictions Precautions Precautions: Knee Precaution Comments: WBAT Restrictions Weight Bearing Restrictions: No      Mobility Bed Mobility            General bed mobility comments: oob  Transfers Overall transfer level: Needs assistance Equipment used: Rolling walker (2 wheeled) Transfers: Sit to/from Stand Sit to Stand: Min guard         General transfer comment: cues for UE/LE placement    Balance Overall balance assessment: Needs assistance           Standing balance-Leahy Scale: Poor Standing balance comment: requires unilateral UE support for balance                            ADL Overall ADL's : Needs assistance/impaired     Grooming: Supervision/safety;Standing   Upper Body Bathing: Set up;Sitting   Lower Body Bathing: Minimal assistance;Sit to/from stand   Upper Body Dressing : Set up;Sitting   Lower Body Dressing: Minimal assistance;Sit to/from stand   Toilet Transfer: Min guard;Ambulation;BSC   Toileting- Architect and Hygiene: Min guard   Tub/ Shower Transfer: Walk-in shower;Min guard;Ambulation     General ADL Comments: Pt would benefit from 3:1 over commode and by her bed at night; she gets up multiple times during the night.  She does have s shower seat.  Practiced shower transfer twice.  Pt initially lightheaded when standing BP 142/70.  When she sat down it was closer to her normal baseline 106/62     Vision     Perception      Praxis      Pertinent Vitals/Pain Pain Assessment: 0-10 Pain Score: 4  Pain Location: L knee/thigh Pain Descriptors / Indicators: Sore Pain Intervention(s): Limited activity within patient's tolerance;Monitored during session;Premedicated before session;Repositioned;Ice applied     Hand Dominance     Extremity/Trunk Assessment Upper Extremity Assessment Upper Extremity Assessment: Overall WFL for tasks assessed          Communication Communication Communication: No difficulties   Cognition Arousal/Alertness: Awake/alert Behavior During Therapy: WFL for tasks assessed/performed Overall Cognitive Status: Within Functional Limits for tasks assessed                     General Comments       Exercises       Shoulder Instructions      Home Living Family/patient expects to be discharged to:: Private residence Living Arrangements: Spouse/significant other   Type of Home: House Home Access: Stairs to enter [employer] of Steps: 1  ( 8" step) Entrance Stairs-Rails: None Home Layout: One level;Laundry or work area in basement     [employer] Shower/Tub: Arts development officer Toilet: Handicapped height     Home Equipment: Environmental consultant - 2 wheels;Cane - single point   Additional Comments: pt works as a Engineer, 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 and will be home with  her      Prior Functioning/Environment Level of Independence: Independent with assistive device(s)        Comments: cane or walker occasionally    OT Diagnosis: Generalized weakness   OT Problem List:     OT Treatment/Interventions:      OT Goals(Current goals can be found in the care plan section) Acute Rehab OT Goals Patient Stated Goal: return to work, knee healed  OT Frequency:     Barriers to D/C:            Co-evaluation              End of Session    Activity Tolerance: Patient tolerated treatment well Patient left: in chair;with call bell/phone  within reach;with family/visitor present   Time: 1610-9604 OT Time Calculation (min): 22 min Charges:  OT General Charges $OT Visit: 1 Procedure OT Evaluation $Initial OT Evaluation Tier I: 1 Procedure G-Codes:    Havilah Topor 03/14/2015, 10:53 AM [name], OTR/L [phone] 2015/03/14

## 2015-03-18 ENCOUNTER — Other Ambulatory Visit (HOSPITAL_COMMUNITY): Payer: Self-pay | Admitting: Orthopedic Surgery

## 2015-03-18 ENCOUNTER — Ambulatory Visit (HOSPITAL_COMMUNITY)
Admission: RE | Admit: 2015-03-18 | Discharge: 2015-03-18 | Disposition: A | Discharge status: Home / Self Care | Payer: Worker's Compensation | Source: Ambulatory Visit | Attending: Urology | Admitting: Urology

## 2015-03-18 DIAGNOSIS — M7989 Other specified soft tissue disorders: Principal | ICD-10-CM

## 2015-03-18 DIAGNOSIS — M79605 Pain in left leg: Secondary | ICD-10-CM | POA: Diagnosis not present

## 2015-03-18 DIAGNOSIS — M79662 Pain in left lower leg: Secondary | ICD-10-CM

## 2015-03-18 DIAGNOSIS — R600 Localized edema: Secondary | ICD-10-CM | POA: Diagnosis not present

## 2015-11-05 ENCOUNTER — Encounter (HOSPITAL_COMMUNITY): Payer: Self-pay

## 2015-11-05 ENCOUNTER — Encounter (HOSPITAL_COMMUNITY)
Admission: RE | Admit: 2015-11-05 | Discharge: 2015-11-05 | Disposition: A | Discharge status: Home / Self Care | Payer: 59 | Source: Ambulatory Visit | Attending: Orthopedic Surgery | Admitting: Orthopedic Surgery

## 2015-11-05 DIAGNOSIS — M1711 Unilateral primary osteoarthritis, right knee: Secondary | ICD-10-CM | POA: Insufficient documentation

## 2015-11-05 DIAGNOSIS — Z01812 Encounter for preprocedural laboratory examination: Secondary | ICD-10-CM | POA: Insufficient documentation

## 2015-11-05 HISTORY — DX: Reserved for inherently not codable concepts without codable children: IMO0001

## 2015-11-05 HISTORY — DX: Stress incontinence (female) (male): N39.3

## 2015-11-05 HISTORY — DX: Constipation, unspecified: K59.00

## 2015-11-05 HISTORY — DX: Unspecified osteoarthritis, unspecified site: M19.90

## 2015-11-05 HISTORY — DX: Other specified abnormal findings of blood chemistry: R79.89

## 2015-11-05 HISTORY — DX: Asymptomatic varicose veins of unspecified lower extremity: I83.90

## 2015-11-05 HISTORY — DX: Anxiety disorder, unspecified: F41.9

## 2015-11-05 LAB — BASIC METABOLIC PANEL
Anion gap: 9 (ref 5–15)
BUN: 11 mg/dL (ref 6–20)
CHLORIDE: 104 mmol/L (ref 101–111)
CO2: 28 mmol/L (ref 22–32)
CREATININE: 0.63 mg/dL (ref 0.44–1.00)
Calcium: 9.4 mg/dL (ref 8.9–10.3)
GFR calc non Af Amer: >60 mL/min (ref >60)
Glucose, Bld: 96 mg/dL (ref 65–99)
POTASSIUM: 4.1 mmol/L (ref 3.5–5.1)
Sodium: 141 mmol/L (ref 135–145)

## 2015-11-05 LAB — URINALYSIS, ROUTINE W REFLEX MICROSCOPIC
Bilirubin Urine: NEGATIVE
Glucose, UA: NEGATIVE mg/dL
Ketones, ur: NEGATIVE mg/dL
Nitrite: NEGATIVE
PH: 7 (ref 5.0–8.0)
Protein, ur: NEGATIVE mg/dL
SPECIFIC GRAVITY, URINE: 1.009 (ref 1.005–1.030)

## 2015-11-05 LAB — URINE MICROSCOPIC-ADD ON

## 2015-11-05 LAB — CBC
HEMATOCRIT: 43.2 % (ref 36.0–46.0)
HEMOGLOBIN: 14.5 g/dL (ref 12.0–15.0)
MCH: 29.4 pg (ref 26.0–34.0)
MCHC: 33.6 g/dL (ref 30.0–36.0)
MCV: 87.6 fL (ref 78.0–100.0)
PLATELETS: 298 10*3/uL (ref 150–400)
RBC: 4.93 MIL/uL (ref 3.87–5.11)
RDW: 13.1 % (ref 11.5–15.5)
WBC: 8.3 10*3/uL (ref 4.0–10.5)

## 2015-11-05 LAB — SURGICAL PCR SCREEN
MRSA, PCR: NEGATIVE
STAPHYLOCOCCUS AUREUS: NEGATIVE

## 2015-11-05 LAB — APTT: aPTT: 28 seconds (ref 24–37)

## 2015-11-05 LAB — PROTIME-INR
INR: 1.14 (ref 0.00–1.49)
Prothrombin Time: 14.8 seconds (ref 11.6–15.2)

## 2015-11-05 NOTE — Progress Notes (Signed)
Abnormal UA faxed to Dr.Olin 

## 2015-11-05 NOTE — Patient Instructions (Addendum)
YOUR PROCEDURE IS SCHEDULED ON : 11/18/15  REPORT TO [facility] MAIN ENTRANCE FOLLOW SIGNS TO EAST ELEVATOR - GO TO 3rd FLOOR CHECK IN AT 3 EAST NURSES STATION (SHORT STAY) AT: 5:45 AM  CALL THIS NUMBER IF YOU HAVE PROBLEMS THE MORNING OF SURGERY [phone]  REMEMBER:ONLY 1 PER PERSON MAY GO TO SHORT STAY WITH YOU TO GET READY THE MORNING OF YOUR SURGERY  DO NOT EAT FOOD OR DRINK LIQUIDS AFTER MIDNIGHT  TAKE THESE MEDICINES THE MORNING OF SURGERY: LORATADINE  YOU MAY NOT HAVE ANY METAL ON YOUR BODY INCLUDING HAIR PINS AND PIERCING'S. DO NOT WEAR JEWELRY, MAKEUP, LOTIONS, POWDERS OR PERFUMES. DO NOT WEAR NAIL POLISH. DO NOT SHAVE 48 HRS PRIOR TO SURGERY. MEN MAY SHAVE FACE AND NECK.  DO NOT BRING VALUABLES TO HOSPITAL. [facility] IS NOT RESPONSIBLE FOR VALUABLES.  CONTACTS, DENTURES OR PARTIALS MAY NOT BE WORN TO SURGERY. LEAVE SUITCASE IN CAR. CAN BE BROUGHT TO ROOM AFTER SURGERY.  PATIENTS DISCHARGED THE DAY OF SURGERY WILL NOT BE ALLOWED TO DRIVE HOME.  PLEASE READ OVER THE FOLLOWING INSTRUCTION SHEETS _________________________________________________________________________________                                          [place] - PREPARING FOR SURGERY  Before surgery, you can play an important role.  Because skin is not sterile, your skin needs to be as free of germs as possible.  You can reduce the number of germs on your skin by washing with CHG (chlorahexidine gluconate) soap before surgery.  CHG is an antiseptic cleaner which kills germs and bonds with the skin to continue killing germs even after washing. Please DO NOT use if you have an allergy to CHG or antibacterial soaps.  If your skin becomes reddened/irritated stop using the CHG and inform your nurse when you arrive at Short Stay. Do not shave (including legs and underarms) for at least 48 hours prior to the first CHG shower.  You may shave your face. Please follow these instructions  carefully:   1.  Shower with CHG Soap the night before surgery and the  morning of Surgery.   2.  If you choose to wash your hair, wash your hair first as usual with your  normal  Shampoo.   3.  After you shampoo, rinse your hair and body thoroughly to remove the  shampoo.                                         4.  Use CHG as you would any other liquid soap.  You can apply chg directly  to the skin and wash . Gently wash with scrungie or clean wascloth    5.  Apply the CHG Soap to your body ONLY FROM THE NECK DOWN.   Do not use on open                           Wound or open sores. Avoid contact with eyes, ears mouth and genitals (private parts).                        Genitals (private parts) with your normal soap.  6.  Wash thoroughly, paying special attention to the area where your surgery  will be performed.   7.  Thoroughly rinse your body with warm water from the neck down.   8.  DO NOT shower/wash with your normal soap after using and rinsing off  the CHG Soap .                9.  Pat yourself dry with a clean towel.             10.  Wear clean night clothes to bed after shower             11.  Place clean sheets on your bed the night of your first shower and do not  sleep with pets.  Day of Surgery : Do not apply any lotions/deodorants the morning of surgery.  Please wear clean clothes to the hospital/surgery center.  FAILURE TO FOLLOW THESE INSTRUCTIONS MAY RESULT IN THE CANCELLATION OF YOUR SURGERY    PATIENT SIGNATURE_________________________________  ______________________________________________________________________     Adam Phenix  An incentive spirometer is a tool that can help keep your lungs clear and active. This tool measures how well you are filling your lungs with each breath. Taking long deep breaths may help reverse or decrease the chance of developing breathing (pulmonary) problems (especially infection) following:  A long  period of time when you are unable to move or be active. BEFORE THE PROCEDURE   If the spirometer includes an indicator to show your best effort, your nurse or respiratory therapist will set it to a desired goal.  If possible, sit up straight or lean slightly forward. Try not to slouch.  Hold the incentive spirometer in an upright position. INSTRUCTIONS FOR USE   Sit on the edge of your bed if possible, or sit up as far as you can in bed or on a chair.  Hold the incentive spirometer in an upright position.  Breathe out normally.  Place the mouthpiece in your mouth and seal your lips tightly around it.  Breathe in slowly and as deeply as possible, raising the piston or the ball toward the top of the column.  Hold your breath for 3-5 seconds or for as long as possible. Allow the piston or ball to fall to the bottom of the column.  Remove the mouthpiece from your mouth and breathe out normally.  Rest for a few seconds and repeat Steps 1 through 7 at least 10 times every 1-2 hours when you are awake. Take your time and take a few normal breaths between deep breaths.  The spirometer may include an indicator to show your best effort. Use the indicator as a goal to work toward during each repetition.  After each set of 10 deep breaths, practice coughing to be sure your lungs are clear. If you have an incision (the cut made at the time of surgery), support your incision when coughing by placing a pillow or rolled up towels firmly against it. Once you are able to get out of bed, walk around indoors and cough well. You may stop using the incentive spirometer when instructed by your caregiver.  RISKS AND COMPLICATIONS  Take your time so you do not get dizzy or light-headed.  If you are in pain, you may need to take or ask for pain medication before doing incentive spirometry. It is harder to take a deep breath if you are having pain. AFTER USE  Rest and breathe slowly and easily.  It can  be helpful to keep track of a log of your progress. Your caregiver can provide you with a simple table to help with this. If you are using the spirometer at home, follow these instructions: [place] IF:   You are having difficultly using the spirometer.  You have trouble using the spirometer as often as instructed.  Your pain medication is not giving enough relief while using the spirometer.  You develop fever of 100.5 F (38.1 C) or higher. SEEK IMMEDIATE MEDICAL CARE IF:   You cough up bloody sputum that had not been present before.  You develop fever of 102 F (38.9 C) or greater.  You develop worsening pain at or near the incision site. MAKE SURE YOU:   Understand these instructions.  Will watch your condition.  Will get help right away if you are not doing well or get worse. Document Released: 12/06/2006 Document Revised: 10/18/2011 Document Reviewed: 02/06/2007 Exit[employer] Patient Information 2014 Exit[employer], [place].   ________________________________________________________________________  WHAT IS A BLOOD TRANSFUSION? Blood Transfusion Information  A transfusion is the replacement of blood or some of its parts. Blood is made up of multiple cells which provide different functions.  Red blood cells carry oxygen and are used for blood loss replacement.  White blood cells fight against infection.  Platelets control bleeding.  Plasma helps clot blood.  Other blood products are available for specialized needs, such as hemophilia or other clotting disorders. BEFORE THE TRANSFUSION  Who gives blood for transfusions?   Healthy volunteers who are fully evaluated to make sure their blood is safe. This is blood bank blood. Transfusion therapy is the safest it has ever been in the practice of medicine. Before blood is taken from a donor, a complete history is taken to make sure that person has no history of diseases nor engages in risky social behavior (examples are  intravenous drug use or sexual activity with multiple partners). The donor's travel history is screened to minimize risk of transmitting infections, such as malaria. The donated blood is tested for signs of infectious diseases, such as HIV and hepatitis. The blood is then tested to be sure it is compatible with you in order to minimize the chance of a transfusion reaction. If you or a relative donates blood, this is often done in anticipation of surgery and is not appropriate for emergency situations. It takes many days to process the donated blood. RISKS AND COMPLICATIONS Although transfusion therapy is very safe and saves many lives, the main dangers of transfusion include:   Getting an infectious disease.  Developing a transfusion reaction. This is an allergic reaction to something in the blood you were given. Every precaution is taken to prevent this. The decision to have a blood transfusion has been considered carefully by your caregiver before blood is given. Blood is not given unless the benefits outweigh the risks. AFTER THE TRANSFUSION  Right after receiving a blood transfusion, you will usually feel much better and more energetic. This is especially true if your red blood cells have gotten low (anemic). The transfusion raises the level of the red blood cells which carry oxygen, and this usually causes an energy increase.  The nurse administering the transfusion will monitor you carefully for complications. HOME CARE INSTRUCTIONS  No special instructions are needed after a transfusion. You may find your energy is better. Speak with your caregiver about any limitations on activity for underlying diseases you may have. SEEK MEDICAL CARE IF:   Your  condition is not improving after your transfusion.  You develop redness or irritation at the intravenous (IV) site. SEEK IMMEDIATE MEDICAL CARE IF:  Any of the following symptoms occur over the next 12 hours:  Shaking chills.  You have a  temperature by mouth above 102 F (38.9 C), not controlled by medicine.  Chest, back, or muscle pain.  People around you feel you are not acting correctly or are confused.  Shortness of breath or difficulty breathing.  Dizziness and fainting.  You get a rash or develop hives.  You have a decrease in urine output.  Your urine turns a dark color or changes to pink, red, or brown. Any of the following symptoms occur over the next 10 days:  You have a temperature by mouth above 102 F (38.9 C), not controlled by medicine.  Shortness of breath.  Weakness after normal activity.  The white part of the eye turns yellow (jaundice).  You have a decrease in the amount of urine or are urinating less often.  Your urine turns a dark color or changes to pink, red, or brown. Document Released: 07/23/2000 Document Revised: 10/18/2011 Document Reviewed: 03/11/2008 [facility] Patient Information 2014 [place], [place].  _______________________________________________________________________

## 2015-11-06 NOTE — H&P (Signed)
TOTAL KNEE ADMISSION H&P  Patient is being admitted for right total knee arthroplasty.  Subjective:  Chief Complaint:    Right knee primary OA / pain  HPI: Darlene Petty, 65 y.o. female, has a history of pain and functional disability in the right knee due to arthritis and has failed non-surgical conservative treatments for greater than 12 weeks to includeNSAID's and/or analgesics, corticosteriod injections and activity modification.  Onset of symptoms was gradual, starting >10 years ago with gradually worsening course since that time. The patient noted prior procedures on the knee to include  arthroplasty on the left knee in July 2016.  Patient currently rates pain in the right knee(s) at 7 out of 10 with activity. Patient has night pain, worsening of pain with activity and weight bearing, pain that interferes with activities of daily living, pain with passive range of motion, crepitus and joint swelling.  Patient has evidence of periarticular osteophytes and joint space narrowing by imaging studies.  There is no active infection.   Risks, benefits and expectations were discussed with the patient.  Risks including but not limited to the risk of anesthesia, blood clots, nerve damage, blood vessel damage, failure of the prosthesis, infection and up to and including death.  Patient understand the risks, benefits and expectations and wishes to proceed with surgery.   PCP: Pcp Not In System  D/C Plans:      Home  Post-op Meds:       No Rx given  Tranexamic Acid:      To be given - IV   Decadron:      Is to be given  FYI:     ASA  Norco  CPAP    Patient Active Problem List   Diagnosis Date Noted  . Obese 03/04/2015  . S/P left TKA 03/03/2015   Past Medical History  Diagnosis Date  . Neuromuscular disorder (HCC)     numbness fingers/hands- bilaterally -right worse than left  . H/O seasonal allergies   . Varicose veins   . Shortness of breath dyspnea     with exertion  . Arthritis   .  Constipation   . Stress incontinence   . Low serum vitamin D   . Anxiety     Past Surgical History  Procedure Laterality Date  . Tubal ligation    . Appendectomy    . Tonsillectomy    . Knee arthroscopy      left  . Total knee arthroplasty Left 03/03/2015    Procedure: LEFT TOTAL KNEE ARTHROPLASTY;  Surgeon: Durene RomansMatthew Olin, MD;  Location: [facility];  Service: Orthopedics;  Laterality: Left;    No prescriptions prior to admission   No Known Allergies   Social History  Substance Use Topics  . Smoking status: Never Smoker   . Smokeless tobacco: Not on file  . Alcohol Use: No       Review of Systems  Constitutional: Negative.   HENT: Negative.   Eyes: Negative.   Respiratory: Negative.   Cardiovascular: Negative.   Gastrointestinal: Positive for constipation.  Genitourinary: Negative.   Musculoskeletal: Positive for joint pain.  Skin: Negative.   Neurological: Negative.   Endo/Heme/Allergies: Negative.   Psychiatric/Behavioral: The patient is nervous/anxious.     Objective:  Physical Exam  Constitutional: She is oriented to person, place, and time. She appears well-developed.  HENT:  Head: Normocephalic.  Eyes: Pupils are equal, round, and reactive to light.  Neck: Neck supple. No JVD present. No tracheal deviation present. No  thyromegaly present.  Cardiovascular: Normal rate, regular rhythm, normal heart sounds and intact distal pulses.   Respiratory: Effort normal and breath sounds normal. No stridor. No respiratory distress. She has no wheezes.  GI: Soft. There is no tenderness. There is no guarding.  Musculoskeletal:       Right knee: She exhibits decreased range of motion, swelling and bony tenderness. She exhibits no ecchymosis, no deformity, no laceration and no erythema. Tenderness found.  Lymphadenopathy:    She has no cervical adenopathy.  Neurological: She is alert and oriented to person, place, and time. A sensory deficit (tingling bilateral UE) is present.   Skin: Skin is warm and dry.  Psychiatric: She has a normal mood and affect.    Vital signs in last 24 hours: Temp:  [98.2 F (36.8 C)] 98.2 F (36.8 C) (03/29 1338) Pulse Rate:  [72] 72 (03/29 1338) Resp:  [16] 16 (03/29 1338) BP: (114)/(74) 114/74 mmHg (03/29 1338) SpO2:  [98 %] 98 % (03/29 1338) Weight:  [83.462 kg (184 lb)] 83.462 kg (184 lb) (03/29 1338)  Labs:   Estimated body mass index is 30.04 kg/(m^2) as calculated from the following:   Height as of 03/03/15:  (1.6 m).   Weight as of 02/21/15: 76.913 kg (169 lb 9 oz).   Imaging Review Plain radiographs demonstrate severe degenerative joint disease of the right knee(s).  The bone quality appears to be good for age and reported activity level.  Assessment/Plan:  End stage arthritis, right knee   The patient history, physical examination, clinical judgment of the provider and imaging studies are consistent with end stage degenerative joint disease of the right knee(s) and total knee arthroplasty is deemed medically necessary. The treatment options including medical management, injection therapy arthroscopy and arthroplasty were discussed at length. The risks and benefits of total knee arthroplasty were presented and reviewed. The risks due to aseptic loosening, infection, stiffness, patella tracking problems, thromboembolic complications and other imponderables were discussed. The patient acknowledged the explanation, agreed to proceed with the plan and consent was signed. Patient is being admitted for inpatient treatment for surgery, pain control, PT, OT, prophylactic antibiotics, VTE prophylaxis, progressive ambulation and ADL's and discharge planning. The patient is planning to be discharged home with home health services.      Anastasio Auerbach Whitt Auletta   PA-C  11/06/2015, 9:27 AM

## 2015-11-17 NOTE — Progress Notes (Signed)
Pt notified of surgery time change to 1:00 pm - instructed to arrive at 10:00 am - inst may have clear liquids until 7:00am

## 2015-11-18 ENCOUNTER — Inpatient Hospital Stay (HOSPITAL_COMMUNITY)
Admission: RE | Admit: 2015-11-18 | Discharge: 2015-11-19 | DRG: 470 | Disposition: A | Discharge status: Home Health | Payer: 59 | Source: Ambulatory Visit | Attending: Orthopedic Surgery | Admitting: Orthopedic Surgery

## 2015-11-18 ENCOUNTER — Inpatient Hospital Stay (HOSPITAL_COMMUNITY): Payer: 59 | Admitting: Anesthesiology

## 2015-11-18 ENCOUNTER — Encounter (HOSPITAL_COMMUNITY): Payer: Self-pay | Admitting: *Deleted

## 2015-11-18 ENCOUNTER — Encounter (HOSPITAL_COMMUNITY)
Admission: RE | Disposition: A | Discharge status: Home Health | Payer: Self-pay | Source: Ambulatory Visit | Attending: Orthopedic Surgery

## 2015-11-18 DIAGNOSIS — Z6831 Body mass index (BMI) 31.0-31.9, adult: Secondary | ICD-10-CM

## 2015-11-18 DIAGNOSIS — Z96651 Presence of right artificial knee joint: Secondary | ICD-10-CM

## 2015-11-18 DIAGNOSIS — Z96659 Presence of unspecified artificial knee joint: Secondary | ICD-10-CM

## 2015-11-18 DIAGNOSIS — Z96652 Presence of left artificial knee joint: Secondary | ICD-10-CM | POA: Diagnosis present

## 2015-11-18 DIAGNOSIS — M25561 Pain in right knee: Secondary | ICD-10-CM | POA: Diagnosis present

## 2015-11-18 DIAGNOSIS — M1711 Unilateral primary osteoarthritis, right knee: Principal | ICD-10-CM | POA: Diagnosis present

## 2015-11-18 DIAGNOSIS — Z01812 Encounter for preprocedural laboratory examination: Secondary | ICD-10-CM

## 2015-11-18 DIAGNOSIS — M659 Synovitis and tenosynovitis, unspecified: Secondary | ICD-10-CM | POA: Diagnosis present

## 2015-11-18 DIAGNOSIS — E669 Obesity, unspecified: Secondary | ICD-10-CM | POA: Diagnosis present

## 2015-11-18 HISTORY — PX: TOTAL KNEE ARTHROPLASTY: SHX125

## 2015-11-18 LAB — TYPE AND SCREEN
ABO/RH(D): A POS
Antibody Screen: NEGATIVE

## 2015-11-18 SURGERY — ARTHROPLASTY, KNEE, TOTAL
Anesthesia: Spinal | Site: Knee | Laterality: Right

## 2015-11-18 MED ORDER — PHENYLEPHRINE HCL 10 MG/ML IJ SOLN
10.0000 mg | INTRAMUSCULAR | Status: DC | PRN
  Administered 2015-11-18: 10 ug/min via INTRAVENOUS

## 2015-11-18 MED ORDER — SODIUM CHLORIDE 0.9 % IV SOLN
1000.0000 mg | Freq: Once | INTRAVENOUS | Status: AC
  Administered 2015-11-18: 1000 mg via INTRAVENOUS
  Filled 2015-11-18: qty 10

## 2015-11-18 MED ORDER — SODIUM CHLORIDE 0.9 % IJ SOLN
INTRAMUSCULAR | Status: DC | PRN
  Administered 2015-11-18: 30 mL

## 2015-11-18 MED ORDER — MIDAZOLAM HCL 5 MG/5ML IJ SOLN
INTRAMUSCULAR | Status: DC | PRN
  Administered 2015-11-18: 2 mg via INTRAVENOUS

## 2015-11-18 MED ORDER — PHENOL 1.4 % MT LIQD: 1.0000 | OROMUCOSAL | Status: DC | PRN

## 2015-11-18 MED ORDER — 0.9 % SODIUM CHLORIDE (POUR BTL) OPTIME
TOPICAL | Status: DC | PRN
  Administered 2015-11-18: 1000 mL

## 2015-11-18 MED ORDER — MIDAZOLAM HCL 2 MG/2ML IJ SOLN
INTRAMUSCULAR | Status: AC
  Filled 2015-11-18: qty 2

## 2015-11-18 MED ORDER — MENTHOL 3 MG MT LOZG: 1.0000 | LOZENGE | OROMUCOSAL | Status: DC | PRN

## 2015-11-18 MED ORDER — HYDROMORPHONE HCL 1 MG/ML IJ SOLN: 0.2500 mg | INTRAMUSCULAR | Status: DC | PRN

## 2015-11-18 MED ORDER — CELECOXIB 200 MG PO CAPS
200.0000 mg | ORAL_CAPSULE | Freq: Two times a day (BID) | ORAL | Status: DC
  Administered 2015-11-18 – 2015-11-19 (×2): 200 mg via ORAL
  Filled 2015-11-18 (×2): qty 1

## 2015-11-18 MED ORDER — BISACODYL 10 MG RE SUPP: 10.0000 mg | Freq: Every day | RECTAL | Status: DC | PRN

## 2015-11-18 MED ORDER — METOCLOPRAMIDE HCL 5 MG/ML IJ SOLN: 5.0000 mg | Freq: Three times a day (TID) | INTRAMUSCULAR | Status: DC | PRN

## 2015-11-18 MED ORDER — PHENYLEPHRINE HCL 10 MG/ML IJ SOLN
INTRAMUSCULAR | Status: AC
  Filled 2015-11-18: qty 1

## 2015-11-18 MED ORDER — SODIUM CHLORIDE 0.9 % IV SOLN
INTRAVENOUS | Status: DC
  Administered 2015-11-18: 22:00:00 via INTRAVENOUS
  Filled 2015-11-18 (×4): qty 1000

## 2015-11-18 MED ORDER — METOCLOPRAMIDE HCL 5 MG PO TABS
5.0000 mg | ORAL_TABLET | Freq: Three times a day (TID) | ORAL | Status: DC | PRN
  Filled 2015-11-18: qty 2

## 2015-11-18 MED ORDER — CHLORHEXIDINE GLUCONATE 4 % EX LIQD: 60.0000 mL | Freq: Once | CUTANEOUS | Status: DC

## 2015-11-18 MED ORDER — MAGNESIUM CITRATE PO SOLN: 1.0000 | Freq: Once | ORAL | Status: DC | PRN

## 2015-11-18 MED ORDER — STERILE WATER FOR IRRIGATION IR SOLN
Status: DC | PRN
  Administered 2015-11-18: 2000 mL

## 2015-11-18 MED ORDER — DEXAMETHASONE SODIUM PHOSPHATE 10 MG/ML IJ SOLN
10.0000 mg | Freq: Once | INTRAMUSCULAR | Status: AC
  Administered 2015-11-19: 10 mg via INTRAVENOUS
  Filled 2015-11-18 (×2): qty 1

## 2015-11-18 MED ORDER — BUPIVACAINE-EPINEPHRINE (PF) 0.25% -1:200000 IJ SOLN
INTRAMUSCULAR | Status: AC
  Filled 2015-11-18: qty 30

## 2015-11-18 MED ORDER — FENTANYL CITRATE (PF) 100 MCG/2ML IJ SOLN
INTRAMUSCULAR | Status: DC | PRN
  Administered 2015-11-18 (×2): 50 ug via INTRAVENOUS

## 2015-11-18 MED ORDER — ALUM & MAG HYDROXIDE-SIMETH 200-200-20 MG/5ML PO SUSP: 30.0000 mL | ORAL | Status: DC | PRN

## 2015-11-18 MED ORDER — DOCUSATE SODIUM 100 MG PO CAPS
100.0000 mg | ORAL_CAPSULE | Freq: Two times a day (BID) | ORAL | Status: DC
  Administered 2015-11-18 – 2015-11-19 (×2): 100 mg via ORAL

## 2015-11-18 MED ORDER — FERROUS SULFATE 325 (65 FE) MG PO TABS
325.0000 mg | ORAL_TABLET | Freq: Three times a day (TID) | ORAL | Status: DC
  Administered 2015-11-19 (×2): 325 mg via ORAL
  Filled 2015-11-18 (×4): qty 1

## 2015-11-18 MED ORDER — CEFAZOLIN SODIUM-DEXTROSE 2-4 GM/100ML-% IV SOLN
INTRAVENOUS | Status: AC
  Filled 2015-11-18: qty 100

## 2015-11-18 MED ORDER — HYDROCODONE-ACETAMINOPHEN 7.5-325 MG PO TABS
1.0000 | ORAL_TABLET | ORAL | Status: DC
  Administered 2015-11-18: 2 via ORAL
  Administered 2015-11-18: 1 via ORAL
  Administered 2015-11-19 (×3): 2 via ORAL
  Filled 2015-11-18: qty 1
  Filled 2015-11-18 (×5): qty 2

## 2015-11-18 MED ORDER — PROPOFOL 10 MG/ML IV BOLUS
INTRAVENOUS | Status: AC
  Filled 2015-11-18: qty 40

## 2015-11-18 MED ORDER — SODIUM CHLORIDE 0.9 % IJ SOLN
INTRAMUSCULAR | Status: AC
  Filled 2015-11-18: qty 50

## 2015-11-18 MED ORDER — LIDOCAINE HCL (CARDIAC) 20 MG/ML IV SOLN
INTRAVENOUS | Status: AC
  Filled 2015-11-18: qty 5

## 2015-11-18 MED ORDER — LACTATED RINGERS IV SOLN
INTRAVENOUS | Status: DC | PRN
  Administered 2015-11-18 (×3): via INTRAVENOUS

## 2015-11-18 MED ORDER — PROPOFOL 500 MG/50ML IV EMUL
INTRAVENOUS | Status: DC | PRN
  Administered 2015-11-18: 100 ug/kg/min via INTRAVENOUS

## 2015-11-18 MED ORDER — ONDANSETRON HCL 4 MG/2ML IJ SOLN
INTRAMUSCULAR | Status: DC | PRN
  Administered 2015-11-18: 4 mg via INTRAVENOUS

## 2015-11-18 MED ORDER — CEFAZOLIN SODIUM-DEXTROSE 2-4 GM/100ML-% IV SOLN
2.0000 g | INTRAVENOUS | Status: AC
  Administered 2015-11-18: 2 g via INTRAVENOUS
  Filled 2015-11-18: qty 100

## 2015-11-18 MED ORDER — FENTANYL CITRATE (PF) 100 MCG/2ML IJ SOLN
INTRAMUSCULAR | Status: AC
  Filled 2015-11-18: qty 2

## 2015-11-18 MED ORDER — ONDANSETRON HCL 4 MG/2ML IJ SOLN: 4.0000 mg | Freq: Four times a day (QID) | INTRAMUSCULAR | Status: DC | PRN

## 2015-11-18 MED ORDER — KETOROLAC TROMETHAMINE 30 MG/ML IJ SOLN
INTRAMUSCULAR | Status: DC | PRN
  Administered 2015-11-18: 30 mg

## 2015-11-18 MED ORDER — DIPHENHYDRAMINE HCL 25 MG PO CAPS: 25.0000 mg | ORAL_CAPSULE | Freq: Four times a day (QID) | ORAL | Status: DC | PRN

## 2015-11-18 MED ORDER — ONDANSETRON HCL 4 MG PO TABS: 4.0000 mg | ORAL_TABLET | Freq: Four times a day (QID) | ORAL | Status: DC | PRN

## 2015-11-18 MED ORDER — HYDROMORPHONE HCL 1 MG/ML IJ SOLN: 0.5000 mg | INTRAMUSCULAR | Status: DC | PRN

## 2015-11-18 MED ORDER — DEXTROSE 5 % IV SOLN
500.0000 mg | Freq: Four times a day (QID) | INTRAVENOUS | Status: DC | PRN
  Filled 2015-11-18: qty 5

## 2015-11-18 MED ORDER — SODIUM CHLORIDE 0.9 % IR SOLN
Status: DC | PRN
  Administered 2015-11-18: 1000 mL

## 2015-11-18 MED ORDER — KETOROLAC TROMETHAMINE 30 MG/ML IJ SOLN
INTRAMUSCULAR | Status: AC
  Filled 2015-11-18: qty 1

## 2015-11-18 MED ORDER — METHOCARBAMOL 500 MG PO TABS
500.0000 mg | ORAL_TABLET | Freq: Four times a day (QID) | ORAL | Status: DC | PRN
  Administered 2015-11-19: 500 mg via ORAL
  Filled 2015-11-18: qty 1

## 2015-11-18 MED ORDER — BUPIVACAINE IN DEXTROSE 0.75-8.25 % IT SOLN
INTRATHECAL | Status: DC | PRN
  Administered 2015-11-18: 1.8 mL via INTRATHECAL

## 2015-11-18 MED ORDER — CEFAZOLIN SODIUM-DEXTROSE 2-4 GM/100ML-% IV SOLN
2.0000 g | Freq: Four times a day (QID) | INTRAVENOUS | Status: AC
  Administered 2015-11-18 – 2015-11-19 (×2): 2 g via INTRAVENOUS
  Filled 2015-11-18 (×2): qty 100

## 2015-11-18 MED ORDER — ONDANSETRON HCL 4 MG/2ML IJ SOLN
INTRAMUSCULAR | Status: AC
  Filled 2015-11-18: qty 2

## 2015-11-18 MED ORDER — BUPIVACAINE-EPINEPHRINE (PF) 0.25% -1:200000 IJ SOLN
INTRAMUSCULAR | Status: DC | PRN
  Administered 2015-11-18: 30 mL

## 2015-11-18 MED ORDER — LORATADINE 10 MG PO TABS
10.0000 mg | ORAL_TABLET | Freq: Every day | ORAL | Status: DC | PRN
  Filled 2015-11-18: qty 1

## 2015-11-18 MED ORDER — DEXAMETHASONE SODIUM PHOSPHATE 10 MG/ML IJ SOLN
10.0000 mg | Freq: Once | INTRAMUSCULAR | Status: AC
  Administered 2015-11-18: 10 mg via INTRAVENOUS

## 2015-11-18 MED ORDER — ASPIRIN EC 325 MG PO TBEC
325.0000 mg | DELAYED_RELEASE_TABLET | Freq: Two times a day (BID) | ORAL | Status: DC
  Administered 2015-11-19: 325 mg via ORAL
  Filled 2015-11-18 (×3): qty 1

## 2015-11-18 MED ORDER — POLYETHYLENE GLYCOL 3350 17 G PO PACK
17.0000 g | PACK | Freq: Two times a day (BID) | ORAL | Status: DC
  Administered 2015-11-18 – 2015-11-19 (×2): 17 g via ORAL

## 2015-11-18 MED ORDER — NAPHAZOLINE-PHENIRAMINE 0.025-0.3 % OP SOLN: 1.0000 [drp] | Freq: Four times a day (QID) | OPHTHALMIC | Status: DC | PRN

## 2015-11-18 MED ORDER — TRANEXAMIC ACID 1000 MG/10ML IV SOLN
1000.0000 mg | Freq: Once | INTRAVENOUS | Status: AC
  Administered 2015-11-18: 1000 mg via INTRAVENOUS
  Filled 2015-11-18: qty 10

## 2015-11-18 SURGICAL SUPPLY — 45 items
BAG DECANTER FOR FLEXI CONT (MISCELLANEOUS) IMPLANT
BAG ZIPLOCK 12X15 (MISCELLANEOUS) IMPLANT
BANDAGE ACE 6X5 VEL STRL LF (GAUZE/BANDAGES/DRESSINGS) ×2 IMPLANT
BLADE SAW SGTL 13.0X1.19X90.0M (BLADE) ×2 IMPLANT
BOWL SMART MIX CTS (DISPOSABLE) ×2 IMPLANT
CAPT KNEE TOTAL 3 ATTUNE ×2 IMPLANT
CEMENT HV SMART SET (Cement) ×4 IMPLANT
CLOTH BEACON ORANGE TIMEOUT ST (SAFETY) ×2 IMPLANT
CUFF TOURN SGL QUICK 34 (TOURNIQUET CUFF) ×1
CUFF TRNQT CYL 34X4X40X1 (TOURNIQUET CUFF) ×1 IMPLANT
DECANTER SPIKE VIAL GLASS SM (MISCELLANEOUS) ×2 IMPLANT
DRAPE U-SHAPE 47X51 STRL (DRAPES) ×2 IMPLANT
DRSG AQUACEL AG ADV 3.5X10 (GAUZE/BANDAGES/DRESSINGS) ×2 IMPLANT
DURAPREP 26ML APPLICATOR (WOUND CARE) ×4 IMPLANT
ELECT REM PT RETURN 9FT ADLT (ELECTROSURGICAL) ×2
ELECTRODE REM PT RTRN 9FT ADLT (ELECTROSURGICAL) ×1 IMPLANT
GLOVE BIOGEL M 7.0 STRL (GLOVE) IMPLANT
GLOVE BIOGEL PI IND STRL 7.5 (GLOVE) ×1 IMPLANT
GLOVE BIOGEL PI IND STRL 8.5 (GLOVE) ×1 IMPLANT
GLOVE BIOGEL PI INDICATOR 7.5 (GLOVE) ×1
GLOVE BIOGEL PI INDICATOR 8.5 (GLOVE) ×1
GLOVE ECLIPSE 8.0 STRL XLNG CF (GLOVE) ×2 IMPLANT
GLOVE ORTHO TXT STRL SZ7.5 (GLOVE) ×4 IMPLANT
GOWN STRL REUS W/TWL LRG LVL3 (GOWN DISPOSABLE) ×2 IMPLANT
GOWN STRL REUS W/TWL XL LVL3 (GOWN DISPOSABLE) ×2 IMPLANT
HANDPIECE INTERPULSE COAX TIP (DISPOSABLE) ×1
LIQUID BAND (GAUZE/BANDAGES/DRESSINGS) ×2 IMPLANT
MANIFOLD NEPTUNE II (INSTRUMENTS) ×2 IMPLANT
PACK TOTAL KNEE CUSTOM (KITS) ×2 IMPLANT
POSITIONER SURGICAL ARM (MISCELLANEOUS) ×2 IMPLANT
SET HNDPC FAN SPRY TIP SCT (DISPOSABLE) ×1 IMPLANT
SET PAD KNEE POSITIONER (MISCELLANEOUS) ×2 IMPLANT
SUCTION FRAZIER HANDLE 12FR (TUBING) ×1
SUCTION TUBE FRAZIER 12FR DISP (TUBING) ×1 IMPLANT
SUT MNCRL AB 4-0 PS2 18 (SUTURE) ×2 IMPLANT
SUT VIC AB 1 CT1 36 (SUTURE) ×2 IMPLANT
SUT VIC AB 2-0 CT1 27 (SUTURE) ×3
SUT VIC AB 2-0 CT1 TAPERPNT 27 (SUTURE) ×3 IMPLANT
SUT VLOC 180 0 24IN GS25 (SUTURE) ×2 IMPLANT
SYR 50ML LL SCALE MARK (SYRINGE) ×2 IMPLANT
TRAY FOLEY W/METER SILVER 14FR (SET/KITS/TRAYS/PACK) ×2 IMPLANT
TRAY FOLEY W/METER SILVER 16FR (SET/KITS/TRAYS/PACK) ×2 IMPLANT
WATER STERILE IRR 1500ML POUR (IV SOLUTION) ×2 IMPLANT
WRAP KNEE MAXI GEL POST OP (GAUZE/BANDAGES/DRESSINGS) ×2 IMPLANT
YANKAUER SUCT BULB TIP 10FT TU (MISCELLANEOUS) ×2 IMPLANT

## 2015-11-18 NOTE — Transfer of Care (Signed)
Immediate Anesthesia Transfer of Care Note  Patient: Darlene Petty  Procedure(s) Performed: Procedure(s): RIGHT TOTAL KNEE ARTHROPLASTY (Right)  Patient Location: PACU  Anesthesia Type:MAC and Spinal  Level of Consciousness: awake, alert  and oriented  Airway & Oxygen Therapy: Patient Spontanous Breathing and Patient connected to face mask oxygen  Post-op Assessment: Report given to RN and Post -op Vital signs reviewed and stable  Post vital signs: Reviewed and stable  Last Vitals:  Filed Vitals:   11/18/15 1323  BP: 149/80  Pulse: 75  Temp: 36.6 C  Resp: 20    Complications: No apparent anesthesia complications

## 2015-11-18 NOTE — Anesthesia Procedure Notes (Signed)
Spinal Patient location during procedure: OR Start time: 11/18/2015 3:59 PM End time: 11/18/2015 4:03 PM Staffing Anesthesiologist: Duane Boston Resident/CRNA: Glory Buff Performed by: resident/CRNA  Preanesthetic Checklist Completed: patient identified, site marked, surgical consent, pre-op evaluation, timeout performed, IV checked, risks and benefits discussed and monitors and equipment checked Spinal Block Patient position: sitting Prep: ChloraPrep Patient monitoring: heart rate, continuous pulse ox and blood pressure Approach: midline Location: L2-3 Injection technique: single-shot Needle Needle type: Sprotte  Needle gauge: 24 G Needle length: 9 cm Needle insertion depth: 6 cm Assessment Sensory level: T6 Additional Notes Spinal kit checked for expiration date, sitting position, sterile prep and drape, 1% xylo for local infiltration, 24 g sprotte needle with introducer used at L 2-3 interspace x 1 stick, +CSF, - heme,-paraesthesia, 1.8 mls of 0.75% spinal marcaine injected, patient tolerated well.

## 2015-11-18 NOTE — Progress Notes (Signed)
Patient states that she doesn't have any issues with sleeping and doesn't wear CPAP or BIPAP and has never worn them.  Pt thinks that she may be confused with another patient on the floor.  RN present for conversation.

## 2015-11-18 NOTE — Op Note (Signed)
NAME:  Darlene Petty                      MEDICAL RECORD NO.:  [id number]                             [facility]      PHYSICIAN:  Madlyn FrankelMatthew D. Charlann Boxerlin, M.D.  DATE OF BIRTH:  Dec 24, 1950      DATE OF PROCEDURE:  11/18/2015                                     OPERATIVE REPORT         PREOPERATIVE DIAGNOSIS:  Right knee osteoarthritis.      POSTOPERATIVE DIAGNOSIS:  Right knee osteoarthritis.      FINDINGS:  The patient was noted to have complete loss of cartilage and   bone-on-bone arthritis with associated osteophytes in the medial and patellofemoral compartments of   the knee with a significant synovitis and associated effusion.      PROCEDURE:  Right total knee replacement.      COMPONENTS USED:  DePuy Attune rotating platform posterior stabilized knee   system, a size 5N femur, 5 tibia, size 8 mm PS AOX  insert, and 38 anatomic patellar   button.      SURGEON:  Madlyn FrankelMatthew D. Charlann Boxerlin, M.D.      ASSISTANT:  Lanney GinsMatthew Babish, PA-C.      ANESTHESIA:  Spinal.      SPECIMENS:  None.      COMPLICATION:  None.      DRAINS:  None  EBL: <50cc      TOURNIQUET TIME:   Total Tourniquet Time Documented: Thigh (Right) - 24 minutes Total: Thigh (Right) - 24 minutes  .      The patient was stable to the recovery room.      INDICATION FOR PROCEDURE:  Darlene Muskratatrice Zwack is a 65 y.o. female patient of   mine.  The patient had been seen, evaluated, and treated conservatively in the   office with medication, activity modification, and injections.  The patient had   radiographic changes of bone-on-bone arthritis with endplate sclerosis and osteophytes noted.      The patient failed conservative measures including medication, injections, and activity modification, and at this point was ready for more definitive measures.   Based on the radiographic changes and failed conservative measures, the patient   decided to proceed with total knee replacement.  Risks of infection,   DVT, component failure,  need for revision surgery, postop course, and   expectations were all   discussed and reviewed.  Consent was obtained for benefit of pain   relief.      PROCEDURE IN DETAIL:  The patient was brought to the operative theater.   Once adequate anesthesia, preoperative antibiotics, 2 gm of Ancef, 1 gm of Tranexamic Acid, and 10 mg of Decadron administered, the patient was positioned supine with the right thigh tourniquet placed.  The  right lower extremity was prepped and draped in sterile fashion.  A time-   out was performed identifying the patient, planned procedure, and   extremity.      The right lower extremity was placed in the [facility] leg holder.  The leg was   exsanguinated, tourniquet elevated to 250 mmHg.  A midline incision was   made followed  by median parapatellar arthrotomy.  Following initial   exposure, attention was first directed to the patella.  Precut   measurement was noted to be 22 mm.  I resected down to 14 mm and used a   38 anatomic patellar button to restore patellar height as well as cover the cut   surface.      The lug holes were drilled and a metal shim was placed to protect the   patella from retractors and saw blades.      At this point, attention was now directed to the femur.  The femoral   canal was opened with a drill, irrigated to try to prevent fat emboli.  An   intramedullary rod was passed at 3 degrees valgus, 9 mm of bone was   resected off the distal femur.  Following this resection, the tibia was   subluxated anteriorly.  Using the extramedullary guide, 2 mm of bone was resected off   the proximal medial tibia.  We confirmed the gap would be   stable medially and laterally with a size 6 insert as well as confirmed   the cut was perpendicular in the coronal plane, checking with an alignment rod.      Once this was done, I sized the femur to be a size 5 in the anterior-   posterior dimension, chose a narrow component based on medial and   lateral  dimension.  The size 5 rotation block was then pinned in   position anterior referenced using the C-clamp to set rotation.  The   anterior, posterior, and  chamfer cuts were made without difficulty nor   notching making certain that I was along the anterior cortex to help   with flexion gap stability.      The final box cut was made off the lateral aspect of distal femur.      At this point, the tibia was sized to be a size 5, the size 5 tray was   then pinned in position through the medial third of the tubercle,   drilled, and keel punched.  Trial reduction was now carried with a 5 femur,  5 tibia, a size 6 then up to an 8 mm insert, and the 38 patella botton.  The knee was brought to   extension, full extension with good flexion stability with the patella   tracking through the trochlea without application of pressure.  Given   all these finding the femoral lug holes were drilled and then the trial components removed.  Final components were   opened and cement was mixed.  The knee was irrigated with normal saline   solution and pulse lavage.  The synovial lining was   then injected with 30 cc of 0.25% Marcaine with epinephrine and 1 cc of Toradol 30 cc of NS for a    total of 61 cc.      The knee was irrigated.  Final implants were then cemented onto clean and   dried cut surfaces of bone with the knee brought to extension with a size 8 mm trial insert.      Once the cement had fully cured, the excess cement was removed   throughout the knee.  I confirmed I was satisfied with the range of   motion and stability, and the final size 8 mm PS AOX insert was chosen.  It was   placed into the knee.      The tourniquet had been let down  at 24 minutes.  No significant   hemostasis required.  The   extensor mechanism was then reapproximated using #1 Vicryl and #0 V-lock sutures with the knee   in flexion.  The   remaining wound was closed with 2-0 Vicryl and running 4-0 Monocryl.   The knee  was cleaned, dried, dressed sterilely using Dermabond and   Aquacel dressing.  The patient was then   brought to recovery room in stable condition, tolerating the procedure   well.   Please note that Physician Assistant, Lanney Gins, PA-C, was present for the entirety of the case, and was utilized for pre-operative positioning, peri-operative retractor management, general facilitation of the procedure.  He was also utilized for primary wound closure at the end of the case.              Madlyn Frankel Charlann Boxer, M.D.    11/18/2015 8:24 PM

## 2015-11-18 NOTE — Anesthesia Preprocedure Evaluation (Signed)
Anesthesia Evaluation    Reviewed: Allergy & Precautions, NPO status , Patient's Chart, lab work & pertinent test results  Airway Mallampati: II  TM Distance: >3 FB Neck ROM: Full    Dental no notable dental hx.    Pulmonary neg pulmonary ROS,    Pulmonary exam normal breath sounds clear to auscultation       Cardiovascular negative cardio ROS Normal cardiovascular exam Rhythm:Regular Rate:Normal     Neuro/Psych Right foot numbness  Neuromuscular disease negative psych ROS   GI/Hepatic negative GI ROS, Neg liver ROS,   Endo/Other  negative endocrine ROS  Renal/GU negative Renal ROS  negative genitourinary   Musculoskeletal negative musculoskeletal ROS (+)   Abdominal   Peds negative pediatric ROS (+)  Hematology negative hematology ROS (+)   Anesthesia Other Findings   Reproductive/Obstetrics negative OB ROS                             Anesthesia Physical  Anesthesia Plan  ASA: II  Anesthesia Plan: Spinal   Post-op Pain Management:    Induction: Intravenous  Airway Management Planned: Natural Airway  Additional Equipment:   Intra-op Plan:   Post-operative Plan:   Informed Consent:   Plan Discussed with: Anesthesiologist  Anesthesia Plan Comments:         Anesthesia Quick Evaluation

## 2015-11-18 NOTE — Anesthesia Postprocedure Evaluation (Signed)
Anesthesia Post Note  Patient: Darlene Petty  Procedure(s) Performed: Procedure(s) (LRB): RIGHT TOTAL KNEE ARTHROPLASTY (Right)  Patient location during evaluation: PACU Anesthesia Type: Spinal and MAC Level of consciousness: awake and alert Pain management: pain level controlled Vital Signs Assessment: post-procedure vital signs reviewed and stable Respiratory status: spontaneous breathing and respiratory function stable Cardiovascular status: blood pressure returned to baseline and stable Postop Assessment: spinal receding Anesthetic complications: no    Last Vitals:  Filed Vitals:   11/18/15 1845 11/18/15 1853  BP: 113/72 115/70  Pulse: 74 61  Temp: 36.6 C 37 C  Resp: 11 12    Last Pain:  Filed Vitals:   11/18/15 1855  PainSc: 0-No pain                 Shamon Cothran DANIEL

## 2015-11-19 ENCOUNTER — Encounter (HOSPITAL_COMMUNITY): Payer: Self-pay | Admitting: Orthopedic Surgery

## 2015-11-19 LAB — BASIC METABOLIC PANEL
Anion gap: 8 (ref 5–15)
BUN: 13 mg/dL (ref 6–20)
CHLORIDE: 105 mmol/L (ref 101–111)
CO2: 24 mmol/L (ref 22–32)
CREATININE: 0.66 mg/dL (ref 0.44–1.00)
Calcium: 8.7 mg/dL — ABNORMAL LOW (ref 8.9–10.3)
GFR calc non Af Amer: >60 mL/min (ref >60)
Glucose, Bld: 185 mg/dL — ABNORMAL HIGH (ref 65–99)
POTASSIUM: 4.2 mmol/L (ref 3.5–5.1)
Sodium: 137 mmol/L (ref 135–145)

## 2015-11-19 LAB — CBC
HEMATOCRIT: 34.1 % — AB (ref 36.0–46.0)
HEMOGLOBIN: 11.5 g/dL — AB (ref 12.0–15.0)
MCH: 28.8 pg (ref 26.0–34.0)
MCHC: 33.7 g/dL (ref 30.0–36.0)
MCV: 85.5 fL (ref 78.0–100.0)
Platelets: 259 10*3/uL (ref 150–400)
RBC: 3.99 MIL/uL (ref 3.87–5.11)
RDW: 12.5 % (ref 11.5–15.5)
WBC: 13.1 10*3/uL — ABNORMAL HIGH (ref 4.0–10.5)

## 2015-11-19 MED ORDER — CELECOXIB 200 MG PO CAPS: 200.0000 mg | ORAL_CAPSULE | Freq: Two times a day (BID) | ORAL | Status: DC

## 2015-11-19 MED ORDER — FERROUS SULFATE 325 (65 FE) MG PO TABS: 325.0000 mg | ORAL_TABLET | Freq: Three times a day (TID) | ORAL | Status: DC

## 2015-11-19 MED ORDER — ASPIRIN 325 MG PO TBEC: 325.0000 mg | DELAYED_RELEASE_TABLET | Freq: Two times a day (BID) | ORAL | Status: AC

## 2015-11-19 MED ORDER — HYDROCODONE-ACETAMINOPHEN 7.5-325 MG PO TABS: 1.0000 | ORAL_TABLET | ORAL | Status: DC | PRN

## 2015-11-19 MED ORDER — POLYETHYLENE GLYCOL 3350 17 G PO PACK: 17.0000 g | PACK | Freq: Two times a day (BID) | ORAL | Status: DC

## 2015-11-19 MED ORDER — METHOCARBAMOL 500 MG PO TABS: 500.0000 mg | ORAL_TABLET | Freq: Four times a day (QID) | ORAL | Status: DC | PRN

## 2015-11-19 MED ORDER — DOCUSATE SODIUM 100 MG PO CAPS: 100.0000 mg | ORAL_CAPSULE | Freq: Two times a day (BID) | ORAL | Status: AC

## 2015-11-19 NOTE — Care Management Note (Signed)
Case Management Note  Patient Details  Name: Brocha Gilliam MRN: [id number] Date of Birth: 15-Oct-1950  Subjective/Objective:                  right total knee arthroplasty. Action/Plan: Discharge planning Expected Discharge Date:  11/19/15               Expected Discharge Plan:  [place]  In-House Referral:     Discharge planning Services  CM Consult  Post Acute Care Choice:    Choice offered to:  Patient  DME Arranged:  N/A DME Agency:  NA  HH Arranged:  Patient Refused [place] Agency:  NA  Status of Service:  Completed, signed off  Medicare Important Message Given:    Date Medicare IM Given:    Medicare IM give by:    Date Additional Medicare IM Given:    Additional Medicare Important Message give by:     If discussed at [place] of Stay Meetings, dates discussed:    Additional Comments: CM met with pt to offer choice and pt declines all [place] services.  Pt worked with Dr. Aurea Graff office and states she is set up with [facility].  NO other CM needs were communicated. Dellie Catholic, RN 11/19/2015, 12:29 PM

## 2015-11-19 NOTE — Evaluation (Signed)
Physical Therapy Evaluation Patient Details Name: Darlene Petty MRN: [id number] DOB: May 25, 1951 Today's Date: 11/19/2015   History of Present Illness  R TKR  Clinical Impression  Pt s/p R TKR presents with decreased R LE strength/ROM and post op pain limiting functional mobility.  Pt should progress to dc home with family assist and HHPT follow up.      Follow Up Recommendations Home health PT    Equipment Recommendations  None recommended by PT    Recommendations for Other Services       Precautions / Restrictions Precautions Precautions: Knee;Fall Restrictions Weight Bearing Restrictions: No Other Position/Activity Restrictions: WBAT      Mobility  Bed Mobility Overal bed mobility: Needs Assistance Bed Mobility: Supine to Sit     Supine to sit: Min guard     General bed mobility comments: cues for sequence and use of LLE to self assist  Transfers Overall transfer level: Needs assistance Equipment used: Rolling walker (2 wheeled) Transfers: Sit to/from Stand Sit to Stand: Min guard         General transfer comment: cues for LE management and use of UEs to self assist  Ambulation/Gait Ambulation/Gait assistance: Min assist Ambulation Distance (Feet): 65 Feet Assistive device: Rolling walker (2 wheeled) Gait Pattern/deviations: Step-to pattern;Decreased step length - right;Decreased step length - left;Shuffle;Trunk flexed Gait velocity: decr Gait velocity interpretation: Below normal speed for age/gender General Gait Details: cues for sequence, posture, and position from [employer]            Wheelchair Mobility    Modified Rankin (Stroke Patients Only)       Balance                                             Pertinent Vitals/Pain Pain Assessment: 0-10 Pain Score: 8  Pain Location: R knee Pain Descriptors / Indicators: Aching;Sore Pain Intervention(s): Limited activity within patient's tolerance;Monitored during  session;Premedicated before session;Ice applied    Home Living Family/patient expects to be discharged to:: Private residence Living Arrangements: Spouse/significant other Available Help at Discharge: Family Type of Home: House Home Access: Stairs to enter Entrance Stairs-Rails: None Entrance Stairs-Number of Steps: 1 Home Layout: One level;Laundry or work area in [employer] Equipment: Environmental consultant - 2 wheels;Cane - single point Additional Comments: pt works as a Engineer, 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 and will be home with her    Prior Function Level of Independence: Independent with assistive device(s)         Comments: cane or walker occasionally     Hand Dominance        Extremity/Trunk Assessment   Upper Extremity Assessment: Overall WFL for tasks assessed           Lower Extremity Assessment: RLE deficits/detail RLE Deficits / Details: 3/5 quads with IND SLR; AAROM at knee -10- 85    Cervical / Trunk Assessment: Normal  Communication   Communication: No difficulties  Cognition Arousal/Alertness: Awake/alert Behavior During Therapy: WFL for tasks assessed/performed Overall Cognitive Status: Within Functional Limits for tasks assessed                      General Comments      Exercises Total Joint Exercises Ankle Circles/Pumps: AROM;Both;15 reps;Supine Quad Sets: AROM;Both;10 reps;Supine Heel Slides: AAROM;15 reps;Supine;Right Straight Leg Raises: AAROM;AROM;Right;15 reps;Supine  Assessment/Plan    PT Assessment    PT Diagnosis Difficulty walking   PT Problem List    PT Treatment Interventions     PT Goals (Current goals can be found in the Care Plan section) Acute Rehab PT Goals Patient Stated Goal: Resume previous lifestyle with decreased pain PT Goal Formulation: With patient Time For Goal Achievement: 11/22/15 Potential to Achieve Goals: Good    Frequency     Barriers to discharge        Co-evaluation                End of Session Equipment Utilized During Treatment: Gait belt Activity Tolerance: Patient tolerated treatment well Patient left: in chair;with call bell/phone within reach;with family/visitor present;with chair alarm set Nurse Communication: Mobility status         Time: [id number] PT Time Calculation (min) (ACUTE ONLY): 38 min   Charges:   PT Evaluation $PT Eval Low Complexity: 1 Procedure PT Treatments $Gait Training: 8-22 mins $Therapeutic Exercise: 8-22 mins   PT G Codes:        Darlene Petty 11/19/2015, 1:24 PM

## 2015-11-19 NOTE — Progress Notes (Signed)
OT Cancellation Note  Patient Details Name: Darlene Petty MRN: [id number] DOB: [birth year]   Cancelled Treatment:    Reason Eval/Treat Not Completed: PT screened, no needs identified, will sign off.  Pt had other knee replaced in July.  [name] 11/19/2015, 11:38 AM  Marica OtterMaryellen Adisson Deak, OTR/L [phone] 11/19/2015

## 2015-11-19 NOTE — Progress Notes (Signed)
Physical Therapy Treatment Patient Details Name: Darlene Petty MRN: [id number] DOB: 08-May-1951 Today's Date: 11/19/2015    History of Present Illness R TKR    PT Comments    Good progress with mobility.  Reviewed therex and stairs.  Pt very motivated and cautioned regarding overdoing.  Follow Up Recommendations  Home health PT (Pt declines HHPT - cautioned about overdoing)     Equipment Recommendations  None recommended by PT    Recommendations for Other Services       Precautions / Restrictions Precautions Precautions: Knee;Fall Restrictions Weight Bearing Restrictions: No Other Position/Activity Restrictions: WBAT    Mobility  Bed Mobility               General bed mobility comments: NT - Pt OOB and declines back to bed  Transfers Overall transfer level: Needs assistance Equipment used: Rolling walker (2 wheeled) Transfers: Sit to/from Stand Sit to Stand: Supervision         General transfer comment: cues for LE management and use of UEs to self assist  Ambulation/Gait Ambulation/Gait assistance: Min guard;Supervision Ambulation Distance (Feet): 100 Feet (and 20' twice to/from bathroom) Assistive device: Rolling walker (2 wheeled) Gait Pattern/deviations: Step-to pattern;Step-through pattern;Decreased step length - right;Decreased step length - left;Shuffle;Trunk flexed Gait velocity: decr Gait velocity interpretation: Below normal speed for age/gender General Gait Details: cues for sequence, posture, and position from RW   Stairs Stairs: Yes Stairs assistance: Min assist Stair Management: No rails;Step to pattern;Forwards;Backwards;With walker Number of Stairs: 4 General stair comments: single step 4x - 3x fwd and once bkwd with RW and cues for sequence and foot/RW placement  Wheelchair Mobility    Modified Rankin (Stroke Patients Only)       Balance                                    Cognition Arousal/Alertness:  Awake/alert Behavior During Therapy: WFL for tasks assessed/performed Overall Cognitive Status: Within Functional Limits for tasks assessed                      Exercises      General Comments        Pertinent Vitals/Pain Pain Assessment: 0-10 Pain Score: 4  Pain Location: R knee Pain Descriptors / Indicators: Aching;Sore Pain Intervention(s): Limited activity within patient's tolerance;Monitored during session;Premedicated before session;Ice applied    Home Living                      Prior Function            PT Goals (current goals can now be found in the care plan section) Acute Rehab PT Goals Patient Stated Goal: Resume previous lifestyle with decreased pain PT Goal Formulation: With patient Time For Goal Achievement: 11/22/15 Potential to Achieve Goals: Good Progress towards PT goals: Progressing toward goals    Frequency       PT Plan      Co-evaluation             End of Session Equipment Utilized During Treatment: Gait belt Activity Tolerance: Patient tolerated treatment well Patient left: in chair;with call bell/phone within reach;with family/visitor present;with chair alarm set     Time: 1340-1405 PT Time Calculation (min) (ACUTE ONLY): 25 min  Charges:  $Gait Training: 8-22 mins $Therapeutic Activity: 8-22 mins  G Codes:      Alek Borges 2015-11-30, 3:47 PM

## 2015-11-19 NOTE — Progress Notes (Signed)
Pt to d/c home. Patient has refused any set up for home health. No DME needs. AVS reviewed and "My Chart" discussed with pt. Pt capable of verbalizing medications, signs and symptoms of infection, and follow-up appointments. Remains hemodynamically stable. No signs and symptoms of distress. Educated pt to return to ER in the case of SOB, dizziness, or chest pain.

## 2015-11-19 NOTE — Progress Notes (Signed)
     Subjective: 1 Day Post-Op Procedure(s) (LRB): RIGHT TOTAL KNEE ARTHROPLASTY (Right)   Patient reports pain as mild, pain controlled. No events throughout the night.  Has already been doing heel slide and trying to felx the knee in the bed.  Looking forward to working with PT and getting better.  Ready to be discharged home.  Objective:   VITALS:   Filed Vitals:   11/19/15 0139 11/19/15 0525  BP: 117/66 123/79  Pulse: 92 81  Temp: 98.5 F (36.9 C) 98.2 F (36.8 C)  Resp: 16 16    Dorsiflexion/Plantar flexion intact Incision: dressing C/D/I No cellulitis present Compartment soft  LABS  Recent Labs  11/19/15 0403  HGB 11.5*  HCT 34.1*  WBC 13.1*  PLT 259     Recent Labs  11/19/15 0403  NA 137  K 4.2  BUN 13  CREATININE 0.66  GLUCOSE 185*     Assessment/Plan: 1 Day Post-Op Procedure(s) (LRB): RIGHT TOTAL KNEE ARTHROPLASTY (Right) Foley cath d/c'ed Advance diet Up with therapy D/C IV fluids Discharge home with home health  [facility]. Follow up with OLIN,Katelynd Blauvelt D in 2 weeks.  Contact information:  [facility] [address], [address] [place] [id number] [phone]    Obese (BMI 30-39.9) Estimated body mass index is 31.89 kg/(m^2) as calculated from the following:   Height as of this encounter: 5\' 3"  (1.6 m).   Weight as of this encounter: 81.647 kg (180 lb). Patient also counseled that weight may inhibit the healing process Patient counseled that losing weight will help with future health issues         Anastasio AuerbachMatthew S. Khalfani Weideman   PAC  11/19/2015, 8:05 AM

## 2015-11-19 NOTE — Discharge Instructions (Signed)

## 2015-11-24 NOTE — Discharge Summary (Signed)
Physician Discharge Summary  Patient ID: Darlene Muskratatrice Ricard MRN: [id number] DOB/AGE: [birth year] 63 y.o.  Admit date: 11/18/2015 Discharge date: 11/19/2015   Procedures:  Procedure(s) (LRB): RIGHT TOTAL KNEE ARTHROPLASTY (Right)  Attending Physician:  Dr. Durene RomansMatthew Olin   Admission Diagnoses:   Right knee primary OA / pain  Discharge Diagnoses:  Principal Problem:   S/P right TKA Active Problems:   Obese   S/P knee replacement  Past Medical History  Diagnosis Date  . Neuromuscular disorder (HCC)     numbness fingers/hands- bilaterally -right worse than left  . H/O seasonal allergies   . Varicose veins   . Shortness of breath dyspnea     with exertion  . Arthritis   . Constipation   . Stress incontinence   . Low serum vitamin D   . Anxiety     HPI:    Darlene Petty, 65 y.o. female, has a history of pain and functional disability in the right knee due to arthritis and has failed non-surgical conservative treatments for greater than 12 weeks to includeNSAID's and/or analgesics, corticosteriod injections and activity modification. Onset of symptoms was gradual, starting >10 years ago with gradually worsening course since that time. The patient noted prior procedures on the knee to include arthroplasty on the left knee in July 2016. Patient currently rates pain in the right knee(s) at 7 out of 10 with activity. Patient has night pain, worsening of pain with activity and weight bearing, pain that interferes with activities of daily living, pain with passive range of motion, crepitus and joint swelling. Patient has evidence of periarticular osteophytes and joint space narrowing by imaging studies. There is no active infection. Risks, benefits and expectations were discussed with the patient. Risks including but not limited to the risk of anesthesia, blood clots, nerve damage, blood vessel damage, failure of the prosthesis, infection and up to and including death. Patient understand  the risks, benefits and expectations and wishes to proceed with surgery.   PCP: Pcp Not In System   Discharged Condition: good  Hospital Course:  Patient underwent the above stated procedure on 11/18/2015. Patient tolerated the procedure well and brought to the recovery room in good condition and subsequently to the floor.  POD #1 BP: 123/79 ; Pulse: 81 ; Temp: 98.2 F (36.8 C) ; Resp: 16 Patient reports pain as mild, pain controlled. No events throughout the night. Has already been doing heel slide and trying to flex the knee in the bed. Looking forward to working with PT and getting better. Ready to be discharged home.  LABS  Basename    HGB     11.5  HCT     34.1    Discharge Exam: General appearance: alert, cooperative and no distress Extremities: Homans sign is negative, no sign of DVT, no edema, redness or tenderness in the calves or thighs and no ulcers, gangrene or trophic changes  Disposition: Home with follow up in 2 weeks   Follow-up Information    Follow up with Shelda PalLIN,Jesslyn Viglione D, MD. Schedule an appointment as soon as possible for a visit in 2 weeks.   Specialty:  Orthopedic Surgery   Contact information:   [address] [place] [address] [phone]       Discharge Instructions    Call MD / Call 911    Complete by:  As directed   If you experience chest pain or shortness of breath, CALL 911 and be transported to the hospital emergency room.  If  you develope a fever above 101 F, pus (white drainage) or increased drainage or redness at the wound, or calf pain, call your surgeon's office.     Change dressing    Complete by:  As directed   Maintain surgical dressing until follow up in the clinic. If the edges start to pull up, may reinforce with tape. If the dressing is no longer working, may remove and cover with gauze and tape, but must keep the area dry and clean.  Call with any questions or concerns.     Constipation Prevention    Complete  by:  As directed   Drink plenty of fluids.  Prune juice may be helpful.  You may use a stool softener, such as Colace (over the counter) 100 mg twice a day.  Use MiraLax (over the counter) for constipation as needed.     Diet - low sodium heart healthy    Complete by:  As directed      Discharge instructions    Complete by:  As directed   Maintain surgical dressing until follow up in the clinic. If the edges start to pull up, may reinforce with tape. If the dressing is no longer working, may remove and cover with gauze and tape, but must keep the area dry and clean.  Follow up in 2 weeks at [facility]. Call with any questions or concerns.     Increase activity slowly as tolerated    Complete by:  As directed   Weight bearing as tolerated with assist device (walker, cane, etc) as directed, use it as long as suggested by your surgeon or therapist, typically at least 4-6 weeks.     TED hose    Complete by:  As directed   Use stockings (TED hose) for 2 weeks on both leg(s).  You may remove them at night for sleeping.             Medication List    STOP taking these medications        naproxen sodium 220 MG tablet  Commonly known as:  ANAPROX      TAKE these medications        aspirin 325 MG EC tablet  Take 1 tablet (325 mg total) by mouth 2 (two) times daily.     celecoxib 200 MG capsule  Commonly known as:  CELEBREX  Take 1 capsule (200 mg total) by mouth every 12 (twelve) hours.     docusate sodium 100 MG capsule  Commonly known as:  COLACE  Take 1 capsule (100 mg total) by mouth 2 (two) times daily.     EYE ALLERGY RELIEF OP  Apply 1 drop to eye 2 (two) times daily as needed (Eye allergies).     ferrous sulfate 325 (65 FE) MG tablet  Take 1 tablet (325 mg total) by mouth 3 (three) times daily after meals.     HYDROcodone-acetaminophen 7.5-325 MG tablet  Commonly known as:  NORCO  Take 1-2 tablets by mouth every 4 (four) hours as needed for moderate pain.      loratadine 10 MG tablet  Commonly known as:  CLARITIN  Take 10 mg by mouth daily as needed for allergies.     methocarbamol 500 MG tablet  Commonly known as:  ROBAXIN  Take 1 tablet (500 mg total) by mouth every 6 (six) hours as needed for muscle spasms.     multivitamin with minerals Tabs tablet  Take 1 tablet by mouth daily.  polyethylene glycol packet  Commonly known as:  MIRALAX / GLYCOLAX  Take 17 g by mouth 2 (two) times daily.     Vitamin D (Ergocalciferol) 50000 units Caps capsule  Commonly known as:  DRISDOL  Take 50,000 Units by mouth 2 (two) times a week.         Signed: Anastasio Auerbach. Shaundrea Carrigg   PA-C  11/24/2015, 12:42 PM

## 2018-05-13 ENCOUNTER — Inpatient Hospital Stay (HOSPITAL_COMMUNITY): Payer: Medicare Other

## 2018-05-13 ENCOUNTER — Inpatient Hospital Stay (HOSPITAL_COMMUNITY)
Admission: AD | Admit: 2018-05-13 | Discharge: 2018-05-16 | DRG: 470 | Disposition: A | Discharge status: Home / Self Care | Payer: Medicare Other | Source: Other Acute Inpatient Hospital | Attending: Family Medicine | Admitting: Family Medicine

## 2018-05-13 ENCOUNTER — Other Ambulatory Visit: Payer: Self-pay

## 2018-05-13 ENCOUNTER — Encounter (HOSPITAL_COMMUNITY): Payer: Self-pay | Admitting: Internal Medicine

## 2018-05-13 DIAGNOSIS — Z96641 Presence of right artificial hip joint: Secondary | ICD-10-CM

## 2018-05-13 DIAGNOSIS — Z96653 Presence of artificial knee joint, bilateral: Secondary | ICD-10-CM | POA: Diagnosis present

## 2018-05-13 DIAGNOSIS — S72001A Fracture of unspecified part of neck of right femur, initial encounter for closed fracture: Principal | ICD-10-CM | POA: Diagnosis present

## 2018-05-13 DIAGNOSIS — Y9301 Activity, walking, marching and hiking: Secondary | ICD-10-CM | POA: Diagnosis present

## 2018-05-13 DIAGNOSIS — E669 Obesity, unspecified: Secondary | ICD-10-CM | POA: Diagnosis present

## 2018-05-13 DIAGNOSIS — S93401A Sprain of unspecified ligament of right ankle, initial encounter: Secondary | ICD-10-CM | POA: Diagnosis present

## 2018-05-13 DIAGNOSIS — Z7982 Long term (current) use of aspirin: Secondary | ICD-10-CM | POA: Diagnosis not present

## 2018-05-13 DIAGNOSIS — Z6833 Body mass index (BMI) 33.0-33.9, adult: Secondary | ICD-10-CM | POA: Diagnosis not present

## 2018-05-13 DIAGNOSIS — J45909 Unspecified asthma, uncomplicated: Secondary | ICD-10-CM | POA: Diagnosis present

## 2018-05-13 DIAGNOSIS — Z79899 Other long term (current) drug therapy: Secondary | ICD-10-CM | POA: Diagnosis not present

## 2018-05-13 DIAGNOSIS — M25571 Pain in right ankle and joints of right foot: Secondary | ICD-10-CM | POA: Diagnosis not present

## 2018-05-13 DIAGNOSIS — Z01810 Encounter for preprocedural cardiovascular examination: Secondary | ICD-10-CM

## 2018-05-13 DIAGNOSIS — R0609 Other forms of dyspnea: Secondary | ICD-10-CM | POA: Diagnosis not present

## 2018-05-13 DIAGNOSIS — M25471 Effusion, right ankle: Secondary | ICD-10-CM

## 2018-05-13 DIAGNOSIS — I5030 Unspecified diastolic (congestive) heart failure: Secondary | ICD-10-CM | POA: Diagnosis not present

## 2018-05-13 DIAGNOSIS — R9431 Abnormal electrocardiogram [ECG] [EKG]: Secondary | ICD-10-CM | POA: Diagnosis present

## 2018-05-13 DIAGNOSIS — W109XXA Fall (on) (from) unspecified stairs and steps, initial encounter: Secondary | ICD-10-CM | POA: Diagnosis present

## 2018-05-13 DIAGNOSIS — T148XXA Other injury of unspecified body region, initial encounter: Secondary | ICD-10-CM

## 2018-05-13 DIAGNOSIS — F419 Anxiety disorder, unspecified: Secondary | ICD-10-CM | POA: Diagnosis present

## 2018-05-13 LAB — URINALYSIS, ROUTINE W REFLEX MICROSCOPIC
BILIRUBIN URINE: NEGATIVE
Glucose, UA: NEGATIVE mg/dL
KETONES UR: NEGATIVE mg/dL
Nitrite: POSITIVE — AB
PROTEIN: NEGATIVE mg/dL
Specific Gravity, Urine: 1.012 (ref 1.005–1.030)
pH: 6 (ref 5.0–8.0)

## 2018-05-13 LAB — MRSA PCR SCREENING: MRSA by PCR: NEGATIVE

## 2018-05-13 LAB — TYPE AND SCREEN
ABO/RH(D): A POS
ANTIBODY SCREEN: NEGATIVE

## 2018-05-13 LAB — ALBUMIN: Albumin: 4.1 g/dL (ref 3.5–5.0)

## 2018-05-13 MED ORDER — TRAMADOL HCL 50 MG PO TABS
50.0000 mg | ORAL_TABLET | Freq: Four times a day (QID) | ORAL | Status: DC | PRN
  Administered 2018-05-14: 50 mg via ORAL
  Filled 2018-05-13 (×2): qty 1

## 2018-05-13 MED ORDER — POVIDONE-IODINE 10 % EX SWAB: 2.0000 "application " | Freq: Once | CUTANEOUS | Status: DC

## 2018-05-13 MED ORDER — CEFAZOLIN SODIUM-DEXTROSE 2-4 GM/100ML-% IV SOLN
2.0000 g | INTRAVENOUS | Status: AC
  Administered 2018-05-14: 2 g via INTRAVENOUS

## 2018-05-13 MED ORDER — ENOXAPARIN SODIUM 30 MG/0.3ML ~~LOC~~ SOLN
30.0000 mg | Freq: Two times a day (BID) | SUBCUTANEOUS | Status: AC
  Administered 2018-05-13: 30 mg via SUBCUTANEOUS
  Filled 2018-05-13: qty 0.3

## 2018-05-13 MED ORDER — HYDROCODONE-ACETAMINOPHEN 5-325 MG PO TABS
1.0000 | ORAL_TABLET | Freq: Four times a day (QID) | ORAL | Status: DC | PRN
  Administered 2018-05-13: 2 via ORAL
  Filled 2018-05-13: qty 2

## 2018-05-13 MED ORDER — TRANEXAMIC ACID 1000 MG/10ML IV SOLN
1000.0000 mg | INTRAVENOUS | Status: AC
  Administered 2018-05-14: 1000 mg via INTRAVENOUS
  Filled 2018-05-13: qty 1000

## 2018-05-13 MED ORDER — CHLORHEXIDINE GLUCONATE 4 % EX LIQD: 60.0000 mL | Freq: Once | CUTANEOUS | Status: DC

## 2018-05-13 MED ORDER — POLYETHYLENE GLYCOL 3350 17 G PO PACK
17.0000 g | PACK | Freq: Every day | ORAL | Status: DC | PRN
  Administered 2018-05-13: 17 g via ORAL
  Filled 2018-05-13: qty 1

## 2018-05-13 MED ORDER — METHOCARBAMOL 1000 MG/10ML IJ SOLN
500.0000 mg | Freq: Four times a day (QID) | INTRAVENOUS | Status: DC | PRN
  Filled 2018-05-13: qty 5

## 2018-05-13 MED ORDER — ACETAMINOPHEN 10 MG/ML IV SOLN
1000.0000 mg | INTRAVENOUS | Status: AC
  Administered 2018-05-14: 1000 mg via INTRAVENOUS
  Filled 2018-05-13: qty 100

## 2018-05-13 MED ORDER — METHOCARBAMOL 500 MG PO TABS
500.0000 mg | ORAL_TABLET | Freq: Four times a day (QID) | ORAL | Status: DC | PRN
  Administered 2018-05-14 – 2018-05-15 (×4): 500 mg via ORAL
  Filled 2018-05-13 (×4): qty 1

## 2018-05-13 MED ORDER — SODIUM CHLORIDE 0.9 % IV SOLN
INTRAVENOUS | Status: DC
  Administered 2018-05-13: 19:00:00 via INTRAVENOUS

## 2018-05-13 MED ORDER — MORPHINE SULFATE (PF) 2 MG/ML IV SOLN: 0.5000 mg | INTRAVENOUS | Status: DC | PRN

## 2018-05-13 NOTE — Consult Note (Signed)
ORTHOPAEDIC CONSULTATION  REQUESTING PHYSICIAN: Toy Baker, MD  PCP:  System, Pcp Not In  Chief Complaint: Right hip fracture  HPI: Darlene Petty is a 67 y.o. female who complains of right hip pain after a mechanical ground-level fall earlier this morning.  Patient missed a step on the front porch, and she landed on her right hip.  She had right hip pain and inability to weight-bear.  She also reports right ankle pain and swelling.  X-rays in the emergency department of the hip revealed a displaced right femoral neck fracture.  She presented to the emergency department in [place].  She then requested transfer to [facility].  She is an established patient of Dr. Paralee Cancel, who has done bilateral total knee replacements.  She normally ambulates without any assist device.  She lives with her husband.  Past Medical History:  Diagnosis Date  . Anxiety   . Arthritis   . Constipation   . H/O seasonal allergies   . Low serum vitamin D   . Neuromuscular disorder (HCC)    numbness fingers/hands- bilaterally -right worse than left  . Shortness of breath dyspnea    with exertion  . Stress incontinence   . Varicose veins    Past Surgical History:  Procedure Laterality Date  . APPENDECTOMY    . KNEE ARTHROSCOPY     left  . TONSILLECTOMY    . TOTAL KNEE ARTHROPLASTY Left 03/03/2015   Procedure: LEFT TOTAL KNEE ARTHROPLASTY;  Surgeon: Paralee Cancel, MD;  Location: [facility];  Service: Orthopedics;  Laterality: Left;  . TOTAL KNEE ARTHROPLASTY Right 11/18/2015   Procedure: RIGHT TOTAL KNEE ARTHROPLASTY;  Surgeon: Paralee Cancel, MD;  Location: [facility];  Service: Orthopedics;  Laterality: Right;  . TUBAL LIGATION     Social History   Socioeconomic History  . Marital status: Married    Spouse name: Not on file  . Number of children: Not on file  . Years of education: Not on file  . Highest education level: Not on file  Occupational History  . Not on file  Social Needs    . Financial resource strain: Not on file  . Food insecurity:    Worry: Not on file    Inability: Not on file  . Transportation needs:    Medical: Not on file    Non-medical: Not on file  Tobacco Use  . Smoking status: Never Smoker  . Smokeless tobacco: Never Used  Substance and Sexual Activity  . Alcohol use: No  . Drug use: No  . Sexual activity: Not on file  Lifestyle  . Physical activity:    Days per week: Not on file    Minutes per session: Not on file  . Stress: Not on file  Relationships  . Social connections:    Talks on phone: Not on file    Gets together: Not on file    Attends religious service: Not on file    Active member of club or organization: Not on file    Attends meetings of clubs or organizations: Not on file    Relationship status: Not on file  Other Topics Concern  . Not on file  Social History Narrative  . Not on file   Family History  Problem Relation Age of Onset  . CAD Mother   . Dementia Mother   . Hypertension Neg Hx   . Kidney Stones Neg Hx    No Known Allergies Prior to Admission medications  Medication Sig Start Date End Date Taking? Authorizing Provider  albuterol (PROVENTIL HFA;VENTOLIN HFA) 108 (90 Base) MCG/ACT inhaler Inhale 1-2 puffs into the lungs every 6 (six) hours as needed for wheezing or shortness of breath.   Yes [provider]  aspirin 325 MG tablet Take 325 mg by mouth as needed (chest pain).   Yes [provider]  cetirizine (ZYRTEC) 10 MG tablet Take 10 mg by mouth as needed for allergies.   Yes [provider]  docusate sodium (COLACE) 100 MG capsule Take 1 capsule (100 mg total) by mouth 2 (two) times daily. Patient taking differently: Take 100 mg by mouth as needed for mild constipation.  11/19/15  Yes Babish, Rodman Key, PA-C  Multiple Vitamin (MULTIVITAMIN WITH MINERALS) TABS tablet Take 1 tablet by mouth daily.   Yes [provider]  Naphazoline-Pheniramine (EYE ALLERGY RELIEF OP)  Apply 1 drop to eye 2 (two) times daily as needed (Eye allergies).   Yes [provider]  celecoxib (CELEBREX) 200 MG capsule Take 1 capsule (200 mg total) by mouth every 12 (twelve) hours. Patient not taking: Reported on 05/13/2018 11/19/15   Danae Orleans, PA-C  ferrous sulfate 325 (65 FE) MG tablet Take 1 tablet (325 mg total) by mouth 3 (three) times daily after meals. Patient not taking: Reported on 05/13/2018 11/19/15   Danae Orleans, PA-C  HYDROcodone-acetaminophen (NORCO) 7.5-325 MG tablet Take 1-2 tablets by mouth every 4 (four) hours as needed for moderate pain. Patient not taking: Reported on 05/13/2018 11/19/15   Danae Orleans, PA-C  methocarbamol (ROBAXIN) 500 MG tablet Take 1 tablet (500 mg total) by mouth every 6 (six) hours as needed for muscle spasms. Patient not taking: Reported on 05/13/2018 11/19/15   Danae Orleans, PA-C  polyethylene glycol (MIRALAX / GLYCOLAX) packet Take 17 g by mouth 2 (two) times daily. Patient not taking: Reported on 05/13/2018 11/19/15   Danae Orleans, PA-C   Chest Portable 1 View  Result Date: 05/13/2018 CLINICAL DATA:  [place] today, pain in right hip, known right hip fx per pt; pre op, no chest complaints, hx asthma; non smoker EXAM: PORTABLE CHEST 1 VIEW COMPARISON:  None. FINDINGS: Cardiac silhouette is normal in size. No mediastinal hilar masses. No evidence of adenopathy. Clear lungs.  No pleural effusion or pneumothorax. Skeletal structures are demineralized but grossly intact. IMPRESSION: No active disease. Electronically Signed   By: Lajean Manes M.D.   On: 05/13/2018 18:24   Dg Hip Unilat With Pelvis 2-3 Views Right  Result Date: 05/13/2018 CLINICAL DATA:  [place] today, pain in right hip, known right hip fx per pt; pre op, no chest complaints, hx asthma; non smoker EXAM: DG HIP (WITH OR WITHOUT PELVIS) 2-3V RIGHT COMPARISON:  None. FINDINGS: Mid right femoral neck fracture. Fractures non comminuted. The distal fracture component has  displaced superiorly by 2 cm. There is mild apex anterior angulation. No significant varus or valgus angulation. No other fractures.  No bone lesions. Hip joints, SI joints and symphysis pubis are normally aligned Soft tissues are unremarkable. IMPRESSION: 1. Mildly displaced, non comminuted, right mid cervical femoral neck fracture. Electronically Signed   By: Lajean Manes M.D.   On: 05/13/2018 18:25    Positive ROS: All other systems have been reviewed and were otherwise negative with the exception of those mentioned in the HPI and as above.  Physical Exam: General: Alert, no acute distress Cardiovascular: No pedal edema Respiratory: No cyanosis, no use of accessory musculature GI: No organomegaly, abdomen is soft  and non-tender Skin: No lesions in the area of chief complaint Neurologic: Sensation intact distally Psychiatric: Patient is competent for consent with normal mood and affect Lymphatic: No axillary or cervical lymphadenopathy  MUSCULOSKELETAL: Examination of the right hip reveals no skin wounds or lesions.  She is shortened and rotated.  She has pain with passive range of motion.  Examination of the right ankle reveals swelling and tenderness to palpation over the distal fibula and anterior talofibular ligament.  The base of the fifth met, body of calcaneus, and fibular head are nontender.  She has positive motor function dorsiflexion, plantarflexion, and great toe extension.  She has subjective sensory change in the superficial peroneal distribution; she has intact sensation posterior tibial and deep peroneal distributions.  She has palpable pulses.  Assessment: Displaced right femoral neck fracture. Right ankle pain.  Plan: I discussed the findings with the patient and her husband.  The hip fracture is going to require a total hip replacement.  They request Dr. Alvan Dame, who plans to perform surgery on Monday.  We will order x-rays of the right ankle.  I have ordered Lovenox to be  given through tomorrow am.  N.p.o. after midnight on Sunday night.  Bedrest for now.  Analgesia.  Preop orders placed.  All questions were solicited and answered.    Bertram Savin, MD Cell [phone]    05/13/2018 8:02 PM

## 2018-05-13 NOTE — H&P (Signed)
Darlene Petty ZOX:[id number] DOB: 10/07/1950 DOA: 05/13/2018     PCP: System, Pcp Not In   Outpatient Specialists:      Orthopedics: Charlann Boxer Patient arrived to ER on  at   Patient coming from: home Lives   With family    Chief Complaint: No chief complaint on file.   HPI: Darlene Petty is a 67 y.o. female with medical history significant of asthma and Knee repair    Presented with  Mechanical fall she was walking down the stairs and fell on cement sidewalk. Noted that her right leg was externally rotated.  Able to walk a flight of stairs with out chest pain Sometimes she gets chest pain if she walks too much. She never had this checked out bc it was not so bad and aspirin takes care of it.   Regarding pertinent Chronic problems: no CAD   While in ER:  The following Work up has been ordered so far:  No orders of the defined types were placed in this encounter.    Following Medications were ordered in ER: Medications - No data to display  Significant initial  Findings: Abnormal Labs Reviewed - No data to display   Na 142 K 3.6  Cr 0.84  stable Lab Results  Component Value Date   CREATININE 0.66 11/19/2015   CREATININE 0.63 11/05/2015   CREATININE 0.71 03/04/2015      WBC  9.2 PLT 267 HG/HCT 14    Component Value Date/Time   HGB 11.5 (L) 11/19/2015 0403   HCT 34.1 (L) 11/19/2015 0403       Troponin (Point of Care Test) No results for input(s): TROPIPOC in the last 72 hours.     BNP (last 3 results) No results for input(s): BNP in the last 8760 hours.  ProBNP (last 3 results) No results for input(s): PROBNP in the last 8760 hours.  Lactic Acid, Venous No results found for: LATICACIDVEN    UA   ordered    CXR - NON acute  Pelvis Mildly displaced, non comminuted, right mid cervical femoral neck  ECG:  Personally reviewed by me showing: HR : 78 Rhythm: NSR     no evidence of acute  ischemic changes, Q wave in lead III QTC 401     ED Triage Vitals  Enc Vitals Group     BP      Pulse      Resp      Temp      Temp src      SpO2      Weight      Height      Head Circumference      Peak Flow      Pain Score      Pain Loc      Pain Edu?      Excl. in GC?   WJXB(14)@       Latest  There were no vitals taken for this visit.    ER Provider Called:  Orthopedics   Dr. Veda Canning They Recommend admit to medicine, plant to operate on Monday Will see in AM   Hospitalist was called for admission for Right hip fracture   Review of Systems:    Pertinent positives include: chest pain dyspnea on exertion,  Constitutional:  No weight loss, night sweats, Fevers, chills, fatigue, weight loss  HEENT:  No headaches, Difficulty swallowing,Tooth/dental problems,Sore throat,  No sneezing, itching, ear ache, nasal congestion, post nasal drip,  Cardio-vascular:  No ,  Orthopnea, PND, anasarca, dizziness, palpitations.no Bilateral lower extremity swelling  GI:  No heartburn, indigestion, abdominal pain, nausea, vomiting, diarrhea, change in bowel habits, loss of appetite, melena, blood in stool, hematemesis Resp:  no shortness of breath at rest. No No excess mucus, no productive cough, No non-productive cough, No coughing up of blood.No change in color of mucus.No wheezing. Skin:  no rash or lesions. No jaundice GU:  no dysuria, change in color of urine, no urgency or frequency. No straining to urinate.  No flank pain.  Musculoskeletal:  No joint pain or no joint swelling. No decreased range of motion. No back pain.  Psych:  No change in mood or affect. No depression or anxiety. No memory loss.  Neuro: no localizing neurological complaints, no tingling, no weakness, no double vision, no gait abnormality, no slurred speech, no confusion  All systems reviewed and apart from HOPI all are negative  Past Medical History:   Past Medical History:  Diagnosis Date  . Anxiety   . Arthritis   . Constipation   . H/O  seasonal allergies   . Low serum vitamin D   . Neuromuscular disorder (HCC)    numbness fingers/hands- bilaterally -right worse than left  . Shortness of breath dyspnea    with exertion  . Stress incontinence   . Varicose veins       Past Surgical History:  Procedure Laterality Date  . APPENDECTOMY    . KNEE ARTHROSCOPY     left  . TONSILLECTOMY    . TOTAL KNEE ARTHROPLASTY Left 03/03/2015   Procedure: LEFT TOTAL KNEE ARTHROPLASTY;  Surgeon: Durene Romans, MD;  Location: [facility];  Service: Orthopedics;  Laterality: Left;  . TOTAL KNEE ARTHROPLASTY Right 11/18/2015   Procedure: RIGHT TOTAL KNEE ARTHROPLASTY;  Surgeon: Durene Romans, MD;  Location: [facility];  Service: Orthopedics;  Laterality: Right;  . TUBAL LIGATION      Social History:  Ambulatory   independently      reports that she has never smoked. She does not have any smokeless tobacco history on file. She reports that she does not drink alcohol or use drugs.     Family History:   Family History  Problem Relation Age of Onset  . CAD Mother   . Dementia Mother   . Hypertension Neg Hx   . Kidney Stones Neg Hx     Allergies: No Known Allergies   Prior to Admission medications   Medication Sig Start Date End Date Taking? Authorizing Provider  celecoxib (CELEBREX) 200 MG capsule Take 1 capsule (200 mg total) by mouth every 12 (twelve) hours. 11/19/15   Lanney Gins, PA-C  docusate sodium (COLACE) 100 MG capsule Take 1 capsule (100 mg total) by mouth 2 (two) times daily. 11/19/15   Lanney Gins, PA-C  ferrous sulfate 325 (65 FE) MG tablet Take 1 tablet (325 mg total) by mouth 3 (three) times daily after meals. 11/19/15   Lanney Gins, PA-C  HYDROcodone-acetaminophen (NORCO) 7.5-325 MG tablet Take 1-2 tablets by mouth every 4 (four) hours as needed for moderate pain. 11/19/15   Lanney Gins, PA-C  loratadine (CLARITIN) 10 MG tablet Take 10 mg by mouth daily as needed for allergies.    [provider]    methocarbamol (ROBAXIN) 500 MG tablet Take 1 tablet (500 mg total) by mouth every 6 (six) hours as needed for muscle spasms. 11/19/15   Lanney Gins, PA-C  Multiple Vitamin (MULTIVITAMIN WITH MINERALS) TABS tablet Take 1 tablet by mouth daily.  [provider]  Naphazoline-Pheniramine (EYE ALLERGY RELIEF OP) Apply 1 drop to eye 2 (two) times daily as needed (Eye allergies).    [provider]  polyethylene glycol (MIRALAX / GLYCOLAX) packet Take 17 g by mouth 2 (two) times daily. 11/19/15   Lanney Gins, PA-C  Vitamin D, Ergocalciferol, (DRISDOL) 50000 units CAPS capsule Take 50,000 Units by mouth 2 (two) times a week. 10/14/15   [provider]   Physical Exam: There were no vitals taken for this visit. 1. General:  in No Acute distress  well   -appearing 2. Psychological: Alert and Oriented 3. Head/ENT:     Dry Mucous Membranes                          Head Non traumatic, neck supple                            Poor Dentition 4. SKIN:   decreased Skin turgor,  Skin clean Dry and intact no rash 5. Heart: Regular rate and rhythm no Murmur, no Rub or gallop 6. Lungs:   no wheezes or crackles   7. Abdomen: Soft,  non-tender, Non distended  bowel sounds present 8. Lower extremities: no clubbing, cyanosis, or  edema 9. Neurologically Grossly intact, moving all 4 extremities equally   10. MSK: Normal range of motion limited in Right lower ext   LABS:    No results for input(s): WBC, NEUTROABS, HGB, HCT, MCV, PLT in the last 168 hours. Basic Metabolic Panel: No results for input(s): NA, K, CL, CO2, GLUCOSE, BUN, CREATININE, CALCIUM, MG, PHOS in the last 168 hours.    No results for input(s): AST, ALT, ALKPHOS, BILITOT, PROT, ALBUMIN in the last 168 hours. No results for input(s): LIPASE, AMYLASE in the last 168 hours. No results for input(s): AMMONIA in the last 168 hours.    HbA1C: No results for input(s): HGBA1C in the last 72 hours. CBG: No results  for input(s): GLUCAP in the last 168 hours.    Urine analysis:    Component Value Date/Time   COLORURINE YELLOW 11/05/2015 1310   APPEARANCEUR CLEAR 11/05/2015 1310   LABSPEC 1.009 11/05/2015 1310   PHURINE 7.0 11/05/2015 1310   GLUCOSEU NEGATIVE 11/05/2015 1310   HGBUR TRACE (A) 11/05/2015 1310   BILIRUBINUR NEGATIVE 11/05/2015 1310   KETONESUR NEGATIVE 11/05/2015 1310   PROTEINUR NEGATIVE 11/05/2015 1310   UROBILINOGEN 0.2 02/21/2015 1400   NITRITE NEGATIVE 11/05/2015 1310   LEUKOCYTESUR LARGE (A) 11/05/2015 1310      Cultures: No results found for: SDES, SPECREQUEST, CULT, REPTSTATUS   Radiological Exams on Admission: No results found.  Chart has been reviewed    Assessment/Plan  67 y.o. female with medical history significant of asthma and Knee repair  Admitted for Right hip fracture  Present on Admission: . Closed right hip fracture [facility]) Plan for Dr. Charlann Boxer to operate on Monday  -  - management as per orthopedics,  Keep nothing by mouth post midnight on MOnday for OR in AM . Patient  not on anticoagulation, antiplatelet agents   on hold Ordered type and screen, Place Foley, order a vitamin D level  Patient at baseline  able to walk a flight of stairs but occasionally has  chest pain shortness of breath.     Patient denies chest pain or shortness of breath currently     ECG showing no evidence of  acute ischemia but abnormal   no known history of coronary artery disease,  But never been evaluated Given advanced age patient is at least moderate  risk   With exertional chest pain .    will order echo     if abnormal - Cardiology consult      Dyspnea and chest pain with exertion - abnormal ECG will obtain echo.  Other plan as per orders.  DVT prophylaxis:  SCD      Code Status:  FULL CODE  as per patient   I had personally discussed CODE STATUS with patient and family  I had spent 15 min discussing goals of care and CODE STATUS  Family Communication:    Family  at  Bedside  plan of care was discussed with  Son Husband  Disposition Plan:   likely will need placement for rehabilitation                                        Consults called: orthopoedics  Admission status:   inpatient     Expect 2 midnight stay secondary to severity of patient's current illness including     I expect  patient to be hospitalized for 2 midnights requiring inpatient medical care.  Patient is at high risk for adverse outcome (such as loss of life or disability) if not treated.  Indication for inpatient stay as follows:    severe pain requiring acute inpatient management,      Need for operative intervention Need for  IV medications      Level of care       medical floor            Darlene Petty 05/13/2018, 6:09 PM    [facility]  Pager [phone]   after 2 AM please page floor coverage PA If 7AM-7PM, please contact the day team taking care of the patient  [email or web address]  Password [email or web address]

## 2018-05-14 ENCOUNTER — Inpatient Hospital Stay (HOSPITAL_COMMUNITY): Payer: Medicare Other

## 2018-05-14 ENCOUNTER — Inpatient Hospital Stay (HOSPITAL_COMMUNITY): Payer: Medicare Other | Admitting: Anesthesiology

## 2018-05-14 ENCOUNTER — Encounter (HOSPITAL_COMMUNITY): Admission: AD | Disposition: A | Discharge status: Home / Self Care | Payer: Self-pay | Attending: Family Medicine

## 2018-05-14 ENCOUNTER — Encounter (HOSPITAL_COMMUNITY): Payer: Self-pay | Admitting: Anesthesiology

## 2018-05-14 DIAGNOSIS — M25571 Pain in right ankle and joints of right foot: Secondary | ICD-10-CM

## 2018-05-14 DIAGNOSIS — S72001A Fracture of unspecified part of neck of right femur, initial encounter for closed fracture: Principal | ICD-10-CM

## 2018-05-14 DIAGNOSIS — M25471 Effusion, right ankle: Secondary | ICD-10-CM

## 2018-05-14 HISTORY — PX: TOTAL HIP ARTHROPLASTY: SHX124

## 2018-05-14 LAB — BASIC METABOLIC PANEL
Anion gap: 9 (ref 5–15)
BUN: 14 mg/dL (ref 8–23)
CO2: 27 mmol/L (ref 22–32)
CREATININE: 0.66 mg/dL (ref 0.44–1.00)
Calcium: 8.7 mg/dL — ABNORMAL LOW (ref 8.9–10.3)
Chloride: 105 mmol/L (ref 98–111)
GFR calc Af Amer: >60 mL/min (ref >60)
Glucose, Bld: 168 mg/dL — ABNORMAL HIGH (ref 70–99)
Potassium: 4.1 mmol/L (ref 3.5–5.1)
SODIUM: 141 mmol/L (ref 135–145)

## 2018-05-14 LAB — CBC
HCT: 40.3 % (ref 36.0–46.0)
Hemoglobin: 13.2 g/dL (ref 12.0–15.0)
MCH: 30 pg (ref 26.0–34.0)
MCHC: 32.8 g/dL (ref 30.0–36.0)
MCV: 91.6 fL (ref 78.0–100.0)
PLATELETS: 244 10*3/uL (ref 150–400)
RBC: 4.4 MIL/uL (ref 3.87–5.11)
RDW: 12.8 % (ref 11.5–15.5)
WBC: 8.1 10*3/uL (ref 4.0–10.5)

## 2018-05-14 LAB — HIV ANTIBODY (ROUTINE TESTING W REFLEX): HIV Screen 4th Generation wRfx: NONREACTIVE

## 2018-05-14 SURGERY — ARTHROPLASTY, HIP, TOTAL, ANTERIOR APPROACH
Anesthesia: Spinal | Site: Hip | Laterality: Right

## 2018-05-14 MED ORDER — HYDROCODONE-ACETAMINOPHEN 5-325 MG PO TABS: 1.0000 | ORAL_TABLET | ORAL | Status: DC | PRN

## 2018-05-14 MED ORDER — FENTANYL CITRATE (PF) 100 MCG/2ML IJ SOLN
INTRAMUSCULAR | Status: AC
  Filled 2018-05-14: qty 2

## 2018-05-14 MED ORDER — ONDANSETRON HCL 4 MG PO TABS: 4.0000 mg | ORAL_TABLET | Freq: Four times a day (QID) | ORAL | Status: DC | PRN

## 2018-05-14 MED ORDER — TRANEXAMIC ACID 1000 MG/10ML IV SOLN
1000.0000 mg | Freq: Once | INTRAVENOUS | Status: AC
  Administered 2018-05-14: 1000 mg via INTRAVENOUS
  Filled 2018-05-14: qty 1000

## 2018-05-14 MED ORDER — STERILE WATER FOR IRRIGATION IR SOLN
Status: DC | PRN
  Administered 2018-05-14: 2000 mL

## 2018-05-14 MED ORDER — DEXAMETHASONE SODIUM PHOSPHATE 10 MG/ML IJ SOLN
INTRAMUSCULAR | Status: DC | PRN
  Administered 2018-05-14: 10 mg via INTRAVENOUS

## 2018-05-14 MED ORDER — DOCUSATE SODIUM 100 MG PO CAPS
100.0000 mg | ORAL_CAPSULE | Freq: Two times a day (BID) | ORAL | Status: DC
  Administered 2018-05-14 – 2018-05-16 (×4): 100 mg via ORAL
  Filled 2018-05-14 (×4): qty 1

## 2018-05-14 MED ORDER — METHOCARBAMOL 500 MG IVPB - SIMPLE MED
INTRAVENOUS | Status: AC
  Administered 2018-05-14: 500 mg
  Filled 2018-05-14: qty 50

## 2018-05-14 MED ORDER — CEFAZOLIN SODIUM-DEXTROSE 1-4 GM/50ML-% IV SOLN
1.0000 g | Freq: Four times a day (QID) | INTRAVENOUS | Status: AC
  Administered 2018-05-14 (×2): 1 g via INTRAVENOUS
  Filled 2018-05-14 (×2): qty 50

## 2018-05-14 MED ORDER — ONDANSETRON HCL 4 MG/2ML IJ SOLN: 4.0000 mg | Freq: Once | INTRAMUSCULAR | Status: DC | PRN

## 2018-05-14 MED ORDER — SODIUM CHLORIDE 0.9 % IV SOLN
INTRAVENOUS | Status: DC | PRN
  Administered 2018-05-14: 25 ug/min via INTRAVENOUS

## 2018-05-14 MED ORDER — PHENOL 1.4 % MT LIQD
1.0000 | OROMUCOSAL | Status: DC | PRN
  Filled 2018-05-14: qty 177

## 2018-05-14 MED ORDER — CEFAZOLIN SODIUM-DEXTROSE 2-4 GM/100ML-% IV SOLN
INTRAVENOUS | Status: AC
  Filled 2018-05-14: qty 100

## 2018-05-14 MED ORDER — 0.9 % SODIUM CHLORIDE (POUR BTL) OPTIME
TOPICAL | Status: DC | PRN
  Administered 2018-05-14: 1000 mL

## 2018-05-14 MED ORDER — ACETAMINOPHEN 500 MG PO TABS
500.0000 mg | ORAL_TABLET | Freq: Four times a day (QID) | ORAL | Status: AC
  Administered 2018-05-14 – 2018-05-15 (×4): 500 mg via ORAL
  Filled 2018-05-14 (×4): qty 1

## 2018-05-14 MED ORDER — METHOCARBAMOL 500 MG IVPB - SIMPLE MED
500.0000 mg | Freq: Four times a day (QID) | INTRAVENOUS | Status: DC | PRN
  Filled 2018-05-14: qty 50

## 2018-05-14 MED ORDER — METHOCARBAMOL 500 MG PO TABS: 500.0000 mg | ORAL_TABLET | Freq: Four times a day (QID) | ORAL | Status: DC | PRN

## 2018-05-14 MED ORDER — ONDANSETRON HCL 4 MG/2ML IJ SOLN: 4.0000 mg | Freq: Four times a day (QID) | INTRAMUSCULAR | Status: DC | PRN

## 2018-05-14 MED ORDER — ASPIRIN 81 MG PO CHEW
81.0000 mg | CHEWABLE_TABLET | Freq: Two times a day (BID) | ORAL | Status: DC
  Administered 2018-05-14 – 2018-05-16 (×4): 81 mg via ORAL
  Filled 2018-05-14 (×4): qty 1

## 2018-05-14 MED ORDER — PROPOFOL 500 MG/50ML IV EMUL
INTRAVENOUS | Status: DC | PRN
  Administered 2018-05-14: 75 ug/kg/min via INTRAVENOUS

## 2018-05-14 MED ORDER — ACETAMINOPHEN 325 MG PO TABS
325.0000 mg | ORAL_TABLET | Freq: Four times a day (QID) | ORAL | Status: DC | PRN
  Administered 2018-05-16: 650 mg via ORAL
  Filled 2018-05-14 (×2): qty 2

## 2018-05-14 MED ORDER — DEXAMETHASONE SODIUM PHOSPHATE 10 MG/ML IJ SOLN
INTRAMUSCULAR | Status: AC
  Filled 2018-05-14: qty 1

## 2018-05-14 MED ORDER — MIDAZOLAM HCL 2 MG/2ML IJ SOLN
INTRAMUSCULAR | Status: AC
  Filled 2018-05-14: qty 2

## 2018-05-14 MED ORDER — LACTATED RINGERS IV SOLN
INTRAVENOUS | Status: DC | PRN
  Administered 2018-05-14 (×3): via INTRAVENOUS

## 2018-05-14 MED ORDER — ACETAMINOPHEN 10 MG/ML IV SOLN
INTRAVENOUS | Status: AC
  Filled 2018-05-14: qty 100

## 2018-05-14 MED ORDER — ONDANSETRON HCL 4 MG/2ML IJ SOLN
INTRAMUSCULAR | Status: AC
  Filled 2018-05-14: qty 2

## 2018-05-14 MED ORDER — PROPOFOL 10 MG/ML IV BOLUS
INTRAVENOUS | Status: AC
  Filled 2018-05-14: qty 20

## 2018-05-14 MED ORDER — FERROUS SULFATE 325 (65 FE) MG PO TABS
325.0000 mg | ORAL_TABLET | Freq: Two times a day (BID) | ORAL | Status: DC
  Filled 2018-05-14 (×2): qty 1

## 2018-05-14 MED ORDER — MIDAZOLAM HCL 5 MG/5ML IJ SOLN
INTRAMUSCULAR | Status: DC | PRN
  Administered 2018-05-14: 2 mg via INTRAVENOUS

## 2018-05-14 MED ORDER — HYDROMORPHONE HCL 1 MG/ML IJ SOLN: 0.2500 mg | INTRAMUSCULAR | Status: DC | PRN

## 2018-05-14 MED ORDER — ALUM & MAG HYDROXIDE-SIMETH 200-200-20 MG/5ML PO SUSP: 30.0000 mL | ORAL | Status: DC | PRN

## 2018-05-14 MED ORDER — FENTANYL CITRATE (PF) 100 MCG/2ML IJ SOLN
INTRAMUSCULAR | Status: DC | PRN
  Administered 2018-05-14: 100 ug via INTRAVENOUS

## 2018-05-14 MED ORDER — ONDANSETRON HCL 4 MG/2ML IJ SOLN
INTRAMUSCULAR | Status: DC | PRN
  Administered 2018-05-14: 4 mg via INTRAVENOUS

## 2018-05-14 MED ORDER — METOCLOPRAMIDE HCL 5 MG PO TABS: 5.0000 mg | ORAL_TABLET | Freq: Three times a day (TID) | ORAL | Status: DC | PRN

## 2018-05-14 MED ORDER — MIDAZOLAM HCL 5 MG/5ML IJ SOLN: INTRAMUSCULAR | Status: DC | PRN

## 2018-05-14 MED ORDER — MENTHOL 3 MG MT LOZG: 1.0000 | LOZENGE | OROMUCOSAL | Status: DC | PRN

## 2018-05-14 MED ORDER — PROPOFOL 10 MG/ML IV BOLUS
INTRAVENOUS | Status: DC | PRN
  Administered 2018-05-14 (×2): 30 mg via INTRAVENOUS

## 2018-05-14 MED ORDER — BUPIVACAINE IN DEXTROSE 0.75-8.25 % IT SOLN
INTRATHECAL | Status: DC | PRN
  Administered 2018-05-14: 1.8 mL via INTRATHECAL

## 2018-05-14 MED ORDER — POTASSIUM CHLORIDE IN NACL 20-0.45 MEQ/L-% IV SOLN
INTRAVENOUS | Status: DC
  Administered 2018-05-14: 16:00:00 via INTRAVENOUS
  Filled 2018-05-14 (×2): qty 1000

## 2018-05-14 MED ORDER — MORPHINE SULFATE (PF) 4 MG/ML IV SOLN: 0.5000 mg | INTRAVENOUS | Status: DC | PRN

## 2018-05-14 MED ORDER — POLYETHYLENE GLYCOL 3350 17 G PO PACK
17.0000 g | PACK | Freq: Every day | ORAL | Status: DC | PRN
  Administered 2018-05-15: 17 g via ORAL
  Filled 2018-05-14: qty 1

## 2018-05-14 MED ORDER — METOCLOPRAMIDE HCL 5 MG/ML IJ SOLN: 5.0000 mg | Freq: Three times a day (TID) | INTRAMUSCULAR | Status: DC | PRN

## 2018-05-14 MED ORDER — DEXAMETHASONE SODIUM PHOSPHATE 10 MG/ML IJ SOLN
10.0000 mg | Freq: Once | INTRAMUSCULAR | Status: AC
  Administered 2018-05-15: 10 mg via INTRAVENOUS
  Filled 2018-05-14: qty 1

## 2018-05-14 MED ORDER — PANTOPRAZOLE SODIUM 40 MG PO TBEC
40.0000 mg | DELAYED_RELEASE_TABLET | Freq: Every day | ORAL | Status: DC
  Administered 2018-05-14 – 2018-05-16 (×3): 40 mg via ORAL
  Filled 2018-05-14 (×3): qty 1

## 2018-05-14 MED ORDER — MEPERIDINE HCL 50 MG/ML IJ SOLN: 6.2500 mg | INTRAMUSCULAR | Status: DC | PRN

## 2018-05-14 MED ORDER — HYDROCODONE-ACETAMINOPHEN 7.5-325 MG PO TABS
1.0000 | ORAL_TABLET | ORAL | Status: DC | PRN
  Filled 2018-05-14: qty 2

## 2018-05-14 SURGICAL SUPPLY — 58 items
BAG DECANTER FOR FLEXI CONT (MISCELLANEOUS) ×3 IMPLANT
BAG ZIPLOCK 12X15 (MISCELLANEOUS) ×3 IMPLANT
BLADE SAW SGTL 11.0X1.19X90.0M (BLADE) IMPLANT
BLADE SAW SGTL 18X1.27X75 (BLADE) ×2 IMPLANT
BLADE SAW SGTL 18X1.27X75MM (BLADE) ×1
COVER SURGICAL LIGHT HANDLE (MISCELLANEOUS) ×3 IMPLANT
CUP ACET PINNACLE SECTR 50MM (Hips) IMPLANT
DERMABOND ADVANCED (GAUZE/BANDAGES/DRESSINGS) ×2
DERMABOND ADVANCED .7 DNX12 (GAUZE/BANDAGES/DRESSINGS) ×1 IMPLANT
DRAPE ORTHO SPLIT 77X108 STRL (DRAPES) ×4
DRAPE POUCH INSTRU U-SHP 10X18 (DRAPES) ×3 IMPLANT
DRAPE SURG 17X11 SM STRL (DRAPES) ×3 IMPLANT
DRAPE SURG ORHT 6 SPLT 77X108 (DRAPES) ×2 IMPLANT
DRAPE U-SHAPE 47X51 STRL (DRAPES) ×3 IMPLANT
DRESSING AQUACEL AG SP 3.5X10 (GAUZE/BANDAGES/DRESSINGS) ×1 IMPLANT
DRSG AQUACEL AG SP 3.5X10 (GAUZE/BANDAGES/DRESSINGS) ×3
DURAPREP 26ML APPLICATOR (WOUND CARE) ×3 IMPLANT
ELECT BLADE TIP CTD 4 INCH (ELECTRODE) ×3 IMPLANT
ELECT REM PT RETURN 15FT ADLT (MISCELLANEOUS) ×3 IMPLANT
ELIMINATOR HOLE APEX DEPUY (Hips) ×2 IMPLANT
FACESHIELD WRAPAROUND (MASK) ×12 IMPLANT
FACESHIELD WRAPAROUND OR TEAM (MASK) ×4 IMPLANT
GLOVE BIOGEL M 7.0 STRL (GLOVE) IMPLANT
GLOVE BIOGEL PI IND STRL 7.5 (GLOVE) ×1 IMPLANT
GLOVE BIOGEL PI IND STRL 8.5 (GLOVE) ×1 IMPLANT
GLOVE BIOGEL PI INDICATOR 7.5 (GLOVE) ×2
GLOVE BIOGEL PI INDICATOR 8.5 (GLOVE) ×2
GLOVE ECLIPSE 8.0 STRL XLNG CF (GLOVE) ×3 IMPLANT
GLOVE ORTHO TXT STRL SZ7.5 (GLOVE) ×6 IMPLANT
GOWN STRL REUS W/TWL LRG LVL3 (GOWN DISPOSABLE) ×3 IMPLANT
GOWN STRL REUS W/TWL XL LVL3 (GOWN DISPOSABLE) ×6 IMPLANT
HEAD FEMORAL 32 CERAMIC (Hips) ×2 IMPLANT
LINER ACET PNNCL PLUS4 NEUTRAL (Hips) IMPLANT
MANIFOLD NEPTUNE II (INSTRUMENTS) ×3 IMPLANT
MARKER SKIN DUAL TIP RULER LAB (MISCELLANEOUS) ×3 IMPLANT
NDL SAFETY ECLIPSE 18X1.5 (NEEDLE) ×1 IMPLANT
NEEDLE HYPO 18GX1.5 SHARP (NEEDLE) ×2
NS IRRIG 1000ML POUR BTL (IV SOLUTION) ×3 IMPLANT
PADDING CAST COTTON 6X4 STRL (CAST SUPPLIES) ×3 IMPLANT
PINNACLE PLUS 4 NEUTRAL (Hips) ×3 IMPLANT
PINNACLE SECTOR CUP 50MM (Hips) ×3 IMPLANT
POSITIONER SURGICAL ARM (MISCELLANEOUS) ×3 IMPLANT
SCREW PINN CAN 6.5X20 (Screw) ×2 IMPLANT
STEM TRI LOC BPS SZ4 W GRIPTON (Hips) IMPLANT
SUCTION FRAZIER HANDLE 10FR (MISCELLANEOUS) ×2
SUCTION TUBE FRAZIER 10FR DISP (MISCELLANEOUS) ×1 IMPLANT
SUT MNCRL AB 4-0 PS2 18 (SUTURE) ×3 IMPLANT
SUT STRATAFIX 0 PDS 27 VIOLET (SUTURE) ×3
SUT VIC AB 1 CT1 36 (SUTURE) ×9 IMPLANT
SUT VIC AB 2-0 CT1 27 (SUTURE) ×4
SUT VIC AB 2-0 CT1 TAPERPNT 27 (SUTURE) ×2 IMPLANT
SUTURE STRATFX 0 PDS 27 VIOLET (SUTURE) ×1 IMPLANT
SYR 50ML LL SCALE MARK (SYRINGE) ×3 IMPLANT
TOWEL OR 17X26 10 PK STRL BLUE (TOWEL DISPOSABLE) ×6 IMPLANT
TRAY FOLEY MTR SLVR 16FR STAT (SET/KITS/TRAYS/PACK) ×3 IMPLANT
TRI LOC BPS SZ 4 W GRIPTON (Hips) ×3 IMPLANT
WATER STERILE IRR 1000ML POUR (IV SOLUTION) ×3 IMPLANT
YANKAUER SUCT BULB TIP 10FT TU (MISCELLANEOUS) ×3 IMPLANT

## 2018-05-14 NOTE — Progress Notes (Addendum)
[facility]  PROGRESS NOTE  Darlene Petty ZOX:[id number] DOB: 04-10-1951 DOA: 05/13/2018 PCP: System, Pcp Not In   Brief HPI:   67 year old femaleWith history of asthma, needed. came to hospital after mechanical fall.  X-ray showed closed right hip fracture.  Orthopedics has been consulted    Subjective   Patient seen and examined, denies any pain.  Plan for surgery today.   Assessment/Plan:     1. Closed right hip fracture-orthopedics has been consulted.  Plan for surgery today.  Patient will need total hip arthroplasty right  2. Abnormal EKG-patient has no history of CAD, denies chest pain .  EKG showed nonspecific ST changes.    3. Asthma-continue PRN albuterol.  No acute exacerbation at this time.  4. Right ankle sprain-continue pain management      CBG: No results for input(s): GLUCAP in the last 168 hours.  CBC: Recent Labs  Lab 05/14/18 0417  WBC 8.1  HGB 13.2  HCT 40.3  MCV 91.6  PLT 244    Basic Metabolic Panel: Recent Labs  Lab 05/14/18 0417  NA 141  K 4.1  CL 105  CO2 27  GLUCOSE 168*  BUN 14  CREATININE 0.66  CALCIUM 8.7*     DVT prophylaxis: SCDs guarded  Code Status: Full code  Family Communication: Discussed with patient's husband at bedside  Disposition Plan: likely home when medically ready for discharge   Consultants:  Orthopedics  Procedures:  None   Antibiotics:   Anti-infectives (From admission, onward)   Start     Dose/Rate Route Frequency Ordered Stop   05/14/18 0948  ceFAZolin (ANCEF) 2-4 GM/100ML-% IVPB    Note to Pharmacy:  Juanda Crumble   : cabinet override      05/14/18 0948 05/14/18 2159   05/14/18 0915  ceFAZolin (ANCEF) IVPB 2g/100 mL premix     2 g 200 mL/hr over 30 Minutes Intravenous On call to O.R. 05/13/18 2031 05/14/18 1112       Objective   Vitals:   05/13/18 1535 05/13/18 2138 05/14/18 0350 05/14/18 0705  BP: 138/83 129/74  137/84  Pulse: 86 80  83  Resp: 16 16  16   Temp: 99  F (37.2 C) 98.9 F (37.2 C)  98.7 F (37.1 C)  TempSrc: Oral Axillary  Oral  SpO2: 96% 97%  95%  Weight:   86.2 kg   Height:   5\' 3"  (1.6 m)     Intake/Output Summary (Last 24 hours) at 05/14/2018 1214 Last data filed at 05/14/2018 1211 Gross per 24 hour  Intake 2773.48 ml  Output 3250 ml  Net -476.52 ml   Filed Weights   05/14/18 0350  Weight: 86.2 kg     Physical Examination:    General: Appears in no acute distress  Cardiovascular: S1-S2, regular  Respiratory: Clear to auscultation bilaterally  Abdomen: Soft, nontender, no organomegaly  Extremities: No edema of the lower extremities, mild tenderness noted at the right hip joint  Neurologic: Alert, oriented x3, no focal deficit noted.     Data Reviewed: I have personally reviewed following labs and imaging studies   Recent Results (from the past 240 hour(s))  MRSA PCR Screening     Status: None   Collection Time: 05/13/18  5:09 PM  Result Value Ref Range Status   MRSA by PCR NEGATIVE NEGATIVE Final    Comment:        The GeneXpert MRSA Assay (FDA approved for NASAL specimens only), is one component of a  comprehensive MRSA colonization surveillance program. It is not intended to diagnose MRSA infection nor to guide or monitor treatment for MRSA infections. Performed at [facility], [address] [place], [place] [address]      Liver Function Tests: Recent Labs  Lab 05/13/18 1649  ALBUMIN 4.1   No results for input(s): LIPASE, AMYLASE in the last 168 hours. No results for input(s): AMMONIA in the last 168 hours.  Cardiac Enzymes: No results for input(s): CKTOTAL, CKMB, CKMBINDEX, TROPONINI in the last 168 hours. BNP (last 3 results) No results for input(s): BNP in the last 8760 hours.  ProBNP (last 3 results) No results for input(s): PROBNP in the last 8760 hours.    Studies: Chest Portable 1 View  Result Date: 05/13/2018 CLINICAL DATA:  Larey Seat today, pain in  right hip, known right hip fx per pt; pre op, no chest complaints, hx asthma; non smoker EXAM: PORTABLE CHEST 1 VIEW COMPARISON:  None. FINDINGS: Cardiac silhouette is normal in size. No mediastinal hilar masses. No evidence of adenopathy. Clear lungs.  No pleural effusion or pneumothorax. Skeletal structures are demineralized but grossly intact. IMPRESSION: No active disease. Electronically Signed   By: Amie Portland M.D.   On: 05/13/2018 18:24   Dg Ankle Right Port  Result Date: 05/13/2018 CLINICAL DATA:  Pain after fall.  Swelling. EXAM: PORTABLE RIGHT ANKLE - 2 VIEW COMPARISON:  None. FINDINGS: Soft tissue calcifications are seen distal to the fibula consistent with age-indeterminate avulsion injuries. Minimal irregularity of the distal medial malleolus, likely from age indeterminate avulsion injury as well. The remainder of the tibia and fibula are intact. The ankle mortise is intact. No other fractures. IMPRESSION: Irregularity of the medial malleolus and soft tissue calcifications distal to the fibula are likely from age indeterminate avulsion injuries. No other evidence of fracture or malalignment. Electronically Signed   By: Gerome Sam III M.D   On: 05/13/2018 21:09   Dg Hip Unilat With Pelvis 2-3 Views Right  Result Date: 05/13/2018 CLINICAL DATA:  Larey Seat today, pain in right hip, known right hip fx per pt; pre op, no chest complaints, hx asthma; non smoker EXAM: DG HIP (WITH OR WITHOUT PELVIS) 2-3V RIGHT COMPARISON:  None. FINDINGS: Mid right femoral neck fracture. Fractures non comminuted. The distal fracture component has displaced superiorly by 2 cm. There is mild apex anterior angulation. No significant varus or valgus angulation. No other fractures.  No bone lesions. Hip joints, SI joints and symphysis pubis are normally aligned Soft tissues are unremarkable. IMPRESSION: 1. Mildly displaced, non comminuted, right mid cervical femoral neck fracture. Electronically Signed   By: Amie Portland  M.D.   On: 05/13/2018 18:25    Scheduled Meds: . chlorhexidine  60 mL Topical Once  . enoxaparin (LOVENOX) injection  30 mg Subcutaneous Q12H  . povidone-iodine  2 application Topical Once      Time spent: 25 min  Meredeth Ide   [facility] Pager [phone]. If 7PM-7AM, please contact night-coverage at [email or web address], Office  [phone]  password [email or web address]  05/14/2018, 12:14 PM  LOS: 1 day

## 2018-05-14 NOTE — H&P (View-Only) (Signed)
Patient ID: Darlene Petty, female   DOB: 29-Dec-1950, 67 y.o.   MRN: [id number]  Right displaced femoral neck fracture Right ankle pain - no fracture by X-ray  I have changed NPO status to now with intention of trying to take her to the operating room today  I will contact OR this am and inform nursing a more specific time   Plan for right total hip replacement for management of her hip fracture and her age

## 2018-05-14 NOTE — Plan of Care (Signed)
  Problem: Education: Goal: Knowledge of General Education information will improve Description Including pain rating scale, medication(s)/side effects and non-pharmacologic comfort measures Outcome: Progressing   Problem: Health Behavior/Discharge Planning: Goal: Ability to manage health-related needs will improve Outcome: Progressing   Problem: Clinical Measurements: Goal: Will remain free from infection Outcome: Progressing Goal: Respiratory complications will improve Outcome: Progressing Goal: Cardiovascular complication will be avoided Outcome: Progressing   Problem: Elimination: Goal: Will not experience complications related to urinary retention Outcome: Progressing   Problem: Pain Managment: Goal: General experience of comfort will improve Outcome: Progressing   Problem: Safety: Goal: Ability to remain free from injury will improve Outcome: Progressing   Problem: Skin Integrity: Goal: Risk for impaired skin integrity will decrease Outcome: Progressing   Reeves Forth, RN 05/14/18 3:06 AM

## 2018-05-14 NOTE — Anesthesia Procedure Notes (Signed)
Spinal  Patient location during procedure: OR Start time: 05/14/2018 10:45 AM End time: 05/14/2018 10:05 AM Staffing Anesthesiologist: Mal Amabile, MD Performed: anesthesiologist  Preanesthetic Checklist Completed: patient identified, site marked, surgical consent, pre-op evaluation, timeout performed, IV checked, risks and benefits discussed and monitors and equipment checked Spinal Block Patient position: right lateral decubitus Prep: site prepped and draped and DuraPrep Patient monitoring: heart rate, cardiac monitor, continuous pulse ox and blood pressure Approach: midline Location: L3-4 Injection technique: single-shot Needle Needle type: Pencan  Needle gauge: 24 G Needle length: 9 cm Needle insertion depth: 7 cm Assessment Sensory level: T6 Additional Notes Patient tolerated procedure well. Adequate sensory level.

## 2018-05-14 NOTE — Anesthesia Procedure Notes (Signed)
Procedure Name: MAC Date/Time: 05/14/2018 10:40 AM Performed by: Lissa Morales, CRNA Pre-anesthesia Checklist: Patient identified, Emergency Drugs available, Suction available, Patient being monitored and Timeout performed Patient Re-evaluated:Patient Re-evaluated prior to induction Oxygen Delivery Method: Simple face mask Placement Confirmation: positive ETCO2

## 2018-05-14 NOTE — Progress Notes (Signed)
Vascular tech notified pt's RN unable to do 2D Echo as pt was in surgery; will be done tomorrow. Alexandro Line, [employer]

## 2018-05-14 NOTE — Progress Notes (Signed)
Patient ID: Darlene Petty, female   DOB: 10/20/1950, 67 y.o.   MRN: [id number]  Right displaced femoral neck fracture Right ankle pain - no fracture by X-ray  I have changed NPO status to now with intention of trying to take her to the operating room today  I will contact OR this am and inform nursing a more specific time   Plan for right total hip replacement for management of her hip fracture and her age 

## 2018-05-14 NOTE — Interval H&P Note (Signed)
History and Physical Interval Note:  05/14/2018 10:36 AM  Darlene Petty  has presented today for surgery, with the diagnosis of Right femoral neck fracture  The various methods of treatment have been discussed with the patient and family. After consideration of risks, benefits and other options for treatment, the patient has consented to  Procedure(s): TOTAL HIP ARTHROPLASTY ANTERIOR APPROACH (Right) as a surgical intervention .  The patient's history has been reviewed, patient examined, no change in status, stable for surgery.  I have reviewed the patient's chart and labs.  Questions were answered to the patient's satisfaction.     [name]

## 2018-05-14 NOTE — Op Note (Signed)
NAME:  Darlene Petty                ACCOUNT NO.: [id number]      MEDICAL RECORD NO.: [id number]      FACILITY:  [facility]      PHYSICIAN:  [name]  DATE OF BIRTH:  June 09, 1951     DATE OF PROCEDURE:  05/14/2018                                 OPERATIVE REPORT         PREOPERATIVE DIAGNOSIS: Right  hip displaced femoral neck fracture.      POSTOPERATIVE DIAGNOSIS:  Right hip displaced femoral neck fracture.      PROCEDURE:  Right total hip replacement through an anterior approach   utilizing DePuy THR system, component size 50mm pinnacle cup, a size 32+4 neutral   Altrex liner, a size 4 Hi Tri Lock stem with a 32+1 delta ceramic   ball.      SURGEON:  Madlyn Frankel. Charlann Boxer, M.D.      ASSISTANT:  Surgical team     ANESTHESIA:  Spinal.      SPECIMENS:  None.      COMPLICATIONS:  None.      BLOOD LOSS:  550 cc     DRAINS:  None.      INDICATION OF THE PROCEDURE:  Darlene Petty is a 67 y.o. female who presented initially to the [place] ER after falling off a porch step landing on her buttock priomarily.  She is a patient of mine with h/o previously performed bilateral TKRs.  After radiogrpahs revealed femoral neck fracture.  Was transferred and admitted to hospitalist service per protocol.  I was consulted for management.  Pros and cons of treatment options reviewed.  Based on displacement and her age I am recommending THR to manage this hip fracture.  Consent was obtained for   benefit of pain relief.  Specific risks of infection, DVT, component   failure, dislocation, neurovascular injury, and need for revision surgery were reviewed in the office as well discussion of   the anterior versus posterior approach were reviewed.     PROCEDURE IN DETAIL:  The patient was brought to operative theater.   Once adequate anesthesia, preoperative antibiotics, 2 gm of Ancef, 1 gm of Tranexamic Acid, and 10 mg of Decadron were administered, the patient was positioned  supine on the [employer] table.  Once the patient was safely positioned with adequate padding of boney prominences we predraped out the hip, and used fluoroscopy to confirm orientation of the pelvis.      The right hip was then prepped and draped from proximal iliac crest to   mid thigh with a shower curtain technique.      Time-out was performed identifying the patient, planned procedure, and the appropriate extremity.     An incision was then made 2 cm lateral to the   anterior superior iliac spine extending over the orientation of the   tensor fascia lata muscle and sharp dissection was carried down to the   fascia of the muscle.      The fascia was then incised.  The muscle belly was identified and swept   laterally and retractor placed along the superior neck.  Following   cauterization of the circumflex vessels and removing some pericapsular   fat, a second cobra retractor was  placed on the inferior neck.  A T-capsulotomy was made along the line of the   superior neck to the trochanteric fossa, then extended proximally and   distally.  Tag sutures were placed and the retractors were then placed   intracapsular.  We then identified the trochanteric fossa and   orientation of my neck cut and then made a neck osteotomy with the femur on traction.  The femoral   head was removed without difficulty or complication.  Traction was let   off and retractors were placed posterior and anterior around the   acetabulum.      The labrum and foveal tissue were debrided.  I began reaming with a 44 mm   reamer and reamed up to 49 mm reamer with good bony bed preparation and a 50 mm  cup was chosen.  The final 50 mm Pinnacle cup was then impacted under fluoroscopy to confirm the depth of penetration and orientation with respect to   Abduction and forward flexion.  A screw was placed into the ilium followed by the hole eliminator.  The final   32+4 neutral Altrex liner was impacted with good visualized  rim fit.  The cup was positioned anatomically within the acetabular portion of the pelvis.      At this point, the femur was rolled to 100 degrees.  Further capsule was   released off the inferior aspect of the femoral neck.  I then   released the superior capsule proximally.  With the leg in a neutral position the hook was placed laterally   along the femur under the vastus lateralis origin and elevated manually and then held in position using the hook attachment on the bed.  The leg was then extended and adducted with the leg rolled to 100   degrees of external rotation.  Retractors were placed along the medial calcar and posteriorly over the greater trochanter.  Once the proximal femur was fully   exposed, I used a box osteotome to set orientation.  I then began   broaching with the starting chili pepper broach and passed this by hand and then broached up to 4.  With the 4 broach in place I chose a high offset neck and did several trial reductions.  The offset was appropriate, leg lengths   appeared to be equal best matched with the +1 head ball trial confirmed radiographically.   Given these findings, I went ahead and dislocated the hip, repositioned all   retractors and positioned the right hip in the extended and abducted position.  The final 4 Hi Tri Lock stem was   chosen and it was impacted down to the level of neck cut.  Based on this   and the trial reductions, a final 32+1 delta ceramic ball was chosen and   impacted onto a clean and dry trunnion, and the hip was reduced.  The   hip had been irrigated throughout the case again at this point.  I did   reapproximate the superior capsular leaflet to the anterior leaflet   using #1 Vicryl.  The fascia of the   tensor fascia lata muscle was then reapproximated using #1 Vicryl and #0 Stratafix sutures.  The   remaining wound was closed with 2-0 Vicryl and running 4-0 Monocryl.   The hip was cleaned, dried, and dressed sterilely using  Dermabond and   Aquacel dressing.  The patient was then brought   to recovery room in stable condition tolerating the procedure  well.            Madlyn Frankel. Charlann Boxer, M.D.        05/14/2018 10:37 AM

## 2018-05-14 NOTE — Transfer of Care (Signed)
Immediate Anesthesia Transfer of Care Note  Patient: Darlene Petty  Procedure(s) Performed: TOTAL HIP ARTHROPLASTY ANTERIOR APPROACH (Right Hip)  Patient Location: PACU  Anesthesia Type:Spinal  Level of Consciousness: awake, alert , oriented and patient cooperative  Airway & Oxygen Therapy: Patient Spontanous Breathing and Patient connected to face mask oxygen  Post-op Assessment: Report given to RN and Post -op Vital signs reviewed and stable  Post vital signs: stable  Last Vitals:  Vitals Value Taken Time  BP 114/61 05/14/2018 12:31 PM  Temp    Pulse 81 05/14/2018 12:31 PM  Resp 17 05/14/2018 12:34 PM  SpO2 97 % 05/14/2018 12:31 PM  Vitals shown include unvalidated device data.  Last Pain:  Vitals:   05/14/18 0705  TempSrc: Oral  PainSc: 1       Patients Stated Pain Goal: 2 (05/14/18 0705)  Complications: No apparent anesthesia complications

## 2018-05-14 NOTE — Anesthesia Preprocedure Evaluation (Addendum)
Anesthesia Evaluation  Patient identified by MRN, date of birth, ID band Patient awake    Reviewed: Allergy & Precautions, NPO status , Patient's Chart, lab work & pertinent test results  Airway Mallampati: II  TM Distance: >3 FB Neck ROM: Full    Dental no notable dental hx. (+) Teeth Intact, Caps,    Pulmonary shortness of breath and with exertion, neg pneumonia ,    Pulmonary exam normal breath sounds clear to auscultation       Cardiovascular negative cardio ROS Normal cardiovascular exam Rhythm:Irregular Rate:Normal     Neuro/Psych Anxiety Numbness both hands and fingers  Neuromuscular disease    GI/Hepatic negative GI ROS, Neg liver ROS,   Endo/Other  negative endocrine ROS  Renal/GU negative Renal ROS   Stress urinary incontinence    Musculoskeletal  (+) Arthritis , Osteoarthritis,  Displaced right femoral neck Fx   Abdominal (+) + obese,   Peds  Hematology negative hematology ROS (+)   Anesthesia Other Findings   Reproductive/Obstetrics                            Anesthesia Physical Anesthesia Plan  ASA: II and emergent  Anesthesia Plan: Spinal   Post-op Pain Management:    Induction:   PONV Risk Score and Plan: 3 and Ondansetron, Dexamethasone, Treatment may vary due to age or medical condition and Midazolam  Airway Management Planned: Natural Airway, Simple Face Mask and Nasal Cannula  Additional Equipment:   Intra-op Plan:   Post-operative Plan:   Informed Consent: I have reviewed the patients History and Physical, chart, labs and discussed the procedure including the risks, benefits and alternatives for the proposed anesthesia with the patient or authorized representative who has indicated his/her understanding and acceptance.   Dental advisory given  Plan Discussed with: CRNA and Surgeon  Anesthesia Plan Comments:        Anesthesia Quick Evaluation

## 2018-05-14 NOTE — Anesthesia Postprocedure Evaluation (Signed)
Anesthesia Post Note  Patient: Luane Rochon  Procedure(s) Performed: TOTAL HIP ARTHROPLASTY ANTERIOR APPROACH (Right Hip)     Patient location during evaluation: PACU Anesthesia Type: Spinal Level of consciousness: oriented and awake and alert Pain management: pain level controlled Vital Signs Assessment: post-procedure vital signs reviewed and stable Respiratory status: spontaneous breathing, respiratory function stable and nonlabored ventilation Cardiovascular status: blood pressure returned to baseline and stable Postop Assessment: no headache, no backache, no apparent nausea or vomiting and spinal receding Anesthetic complications: no    Last Vitals:  Vitals:   05/14/18 1300 05/14/18 1315  BP: (!) 119/94 111/68  Pulse: 66 87  Resp: 17 20  Temp:    SpO2: 99% 94%    Last Pain:  Vitals:   05/14/18 1300  TempSrc:   PainSc: 0-No pain                 Darah Simkin A.

## 2018-05-14 NOTE — CV Procedure (Signed)
2D echo attempted, but patient in surgery. Will try tomorrow

## 2018-05-15 ENCOUNTER — Encounter (HOSPITAL_COMMUNITY): Payer: Self-pay | Admitting: Orthopedic Surgery

## 2018-05-15 ENCOUNTER — Inpatient Hospital Stay (HOSPITAL_COMMUNITY): Payer: Medicare Other

## 2018-05-15 ENCOUNTER — Encounter (HOSPITAL_COMMUNITY): Admission: AD | Disposition: A | Discharge status: Home / Self Care | Payer: Self-pay | Attending: Family Medicine

## 2018-05-15 DIAGNOSIS — I5030 Unspecified diastolic (congestive) heart failure: Secondary | ICD-10-CM

## 2018-05-15 LAB — CBC
HCT: 35.9 % — ABNORMAL LOW (ref 36.0–46.0)
Hemoglobin: 12 g/dL (ref 12.0–15.0)
MCH: 30.2 pg (ref 26.0–34.0)
MCHC: 33.4 g/dL (ref 30.0–36.0)
MCV: 90.4 fL (ref 78.0–100.0)
Platelets: 233 10*3/uL (ref 150–400)
RBC: 3.97 MIL/uL (ref 3.87–5.11)
RDW: 12.6 % (ref 11.5–15.5)
WBC: 14.4 10*3/uL — AB (ref 4.0–10.5)

## 2018-05-15 LAB — ECHOCARDIOGRAM COMPLETE
Height: 63 in
Weight: 3040 oz

## 2018-05-15 LAB — BASIC METABOLIC PANEL
ANION GAP: 9 (ref 5–15)
BUN: 9 mg/dL (ref 8–23)
CALCIUM: 8.6 mg/dL — AB (ref 8.9–10.3)
CO2: 28 mmol/L (ref 22–32)
Chloride: 101 mmol/L (ref 98–111)
Creatinine, Ser: 0.57 mg/dL (ref 0.44–1.00)
GFR calc Af Amer: >60 mL/min (ref >60)
GLUCOSE: 148 mg/dL — AB (ref 70–99)
Potassium: 4.4 mmol/L (ref 3.5–5.1)
SODIUM: 138 mmol/L (ref 135–145)

## 2018-05-15 LAB — VITAMIN D 25 HYDROXY (VIT D DEFICIENCY, FRACTURES): VIT D 25 HYDROXY: 24.3 ng/mL — AB (ref 30.0–100.0)

## 2018-05-15 SURGERY — ARTHROPLASTY, HIP, TOTAL, ANTERIOR APPROACH
Anesthesia: Choice | Laterality: Right

## 2018-05-15 MED ORDER — TRAMADOL HCL 50 MG PO TABS
50.0000 mg | ORAL_TABLET | Freq: Four times a day (QID) | ORAL | Status: DC | PRN
  Administered 2018-05-15: 50 mg via ORAL

## 2018-05-15 NOTE — Progress Notes (Signed)
Physical Therapy Treatment Patient Details Name: Darlene Petty MRN: [id number] DOB: 1950/12/28 Today's Date: 05/15/2018    History of Present Illness 67 y.o. female admitted with a fall, R hip fx, s/p R DA-THA. Pt also reports R ankle sprain.  PMH B TKA.     PT Comments    Pt progressing well with mobility, she ambulated 95' with RW. Instructed pt in THA HEP, she demonstrates good understanding. Will plan to do stair training tomorrow morning.   Follow Up Recommendations  Follow surgeon's recommendation for DC plan and follow-up therapies     Equipment Recommendations  None recommended by PT    Recommendations for Other Services       Precautions / Restrictions Precautions Precautions: Fall Restrictions Weight Bearing Restrictions: No RLE Weight Bearing: Weight bearing as tolerated    Mobility  Bed Mobility Overal bed mobility: Needs Assistance Bed Mobility: Sit to Supine     Supine to sit: Min assist Sit to supine: Min assist   General bed mobility comments: assist for RLE  Transfers Overall transfer level: Needs assistance Equipment used: Rolling walker (2 wheeled) Transfers: Sit to/from Stand Sit to Stand: Min guard;From elevated surface         General transfer comment: VCs hand placement  Ambulation/Gait Ambulation/Gait assistance: Min guard Gait Distance (Feet): 30 Feet Assistive device: Rolling walker (2 wheeled) Gait Pattern/deviations: Step-to pattern;Decreased step length - right;Decreased step length - left;Antalgic;Decreased weight shift to right Gait velocity: decr   General Gait Details: VCs sequencing with RW, distance limited by pain   Stairs             Wheelchair Mobility    Modified Rankin (Stroke Patients Only)       Balance Overall balance assessment: Modified Independent                                          Cognition Arousal/Alertness: Awake/alert Behavior During Therapy: WFL for tasks  assessed/performed Overall Cognitive Status: Within Functional Limits for tasks assessed                                        Exercises Total Joint Exercises Ankle Circles/Pumps: AROM;Both;10 reps;Supine Quad Sets: AROM;Right;10 reps;Supine Short Arc Quad: AROM;Right;10 reps;Supine Heel Slides: AAROM;Right;10 reps;Supine Hip ABduction/ADduction: AAROM;Right;10 reps;Supine Long Arc Quad: AROM;Right;5 reps;Seated    General Comments        Pertinent Vitals/Pain Pain Assessment: 0-10 Pain Score: 5  Pain Location:  R hip Pain Descriptors / Indicators: Sore Pain Intervention(s): Limited activity within patient's tolerance;Monitored during session;Premedicated before session;Ice applied    Home Living Family/patient expects to be discharged to:: Private residence Living Arrangements: Spouse/significant other Available Help at Discharge: Family Type of Home: House Home Access: Stairs to enter Entrance Stairs-Rails: None Home Layout: One level;Laundry or work area in [employer] Equipment: Environmental consultant - 2 wheels;Cane - single point      Prior Function Level of Independence: Independent          PT Goals (current goals can now be found in the care plan section) Acute Rehab PT Goals Patient Stated Goal: return to independence with mobility PT Goal Formulation: With patient/family Time For Goal Achievement: 05/22/18 Potential to Achieve Goals: Good Progress towards PT goals: Progressing toward goals    Frequency  7X/week      PT Plan Current plan remains appropriate    Co-evaluation              AM-PAC PT "6 Clicks" Daily Activity  Outcome Measure  Difficulty turning over in bed (including adjusting bedclothes, sheets and blankets)?: A Lot Difficulty moving from lying on back to sitting on the side of the bed? : Unable Difficulty sitting down on and standing up from a chair with arms (e.g., wheelchair, bedside commode, etc,.)?: A  Little Help needed moving to and from a bed to chair (including a wheelchair)?: A Little Help needed walking in hospital room?: A Little Help needed climbing 3-5 steps with a railing? : A Lot 6 Click Score: 14    End of Session Equipment Utilized During Treatment: Gait belt Activity Tolerance: Patient tolerated treatment well Patient left: in bed;with call bell/phone within reach;with family/visitor present;with SCD's reapplied Nurse Communication: Mobility status PT Visit Diagnosis: Difficulty in walking, not elsewhere classified (R26.2);Pain;History of falling (Z91.81) Pain - Right/Left: Right Pain - part of body: Hip     Time: 1610-9604 PT Time Calculation (min) (ACUTE ONLY): 42 min  Charges:  $Gait Training: 8-22 mins $Therapeutic Exercise: 8-22 mins $Therapeutic Activity: 8-22 mins                     Ralene Bathe Kistler PT 05/15/2018  Acute Rehabilitation Services Pager [phone] Office [phone]

## 2018-05-15 NOTE — Discharge Instructions (Signed)

## 2018-05-15 NOTE — Plan of Care (Signed)
  Problem: Spiritual Needs Goal: Ability to function at adequate level Outcome: Progressing   Problem: Education: Goal: Knowledge of General Education information will improve Description Including pain rating scale, medication(s)/side effects and non-pharmacologic comfort measures Outcome: Progressing   Problem: Health Behavior/Discharge Planning: Goal: Ability to manage health-related needs will improve Outcome: Progressing   Problem: Clinical Measurements: Goal: Ability to maintain clinical measurements within normal limits will improve Outcome: Progressing Goal: Will remain free from infection Outcome: Progressing Goal: Diagnostic test results will improve Outcome: Progressing Goal: Respiratory complications will improve Outcome: Progressing Goal: Cardiovascular complication will be avoided Outcome: Progressing   Problem: Activity: Goal: Risk for activity intolerance will decrease Outcome: Progressing   Problem: Elimination: Goal: Will not experience complications related to bowel motility Outcome: Progressing Goal: Will not experience complications related to urinary retention Outcome: Progressing   Problem: Pain Managment: Goal: General experience of comfort will improve Outcome: Progressing   Problem: Safety: Goal: Ability to remain free from injury will improve Outcome: Progressing   Problem: Skin Integrity: Goal: Risk for impaired skin integrity will decrease Outcome: Progressing   Problem: Education: Goal: Verbalization of understanding the information provided (i.e., activity precautions, restrictions, etc) will improve Outcome: Progressing Goal: Individualized Educational Video(s) Outcome: Progressing   Problem: Activity: Goal: Ability to ambulate and perform ADLs will improve Outcome: Progressing   Problem: Clinical Measurements: Goal: Postoperative complications will be avoided or minimized Outcome: Progressing   Problem:  Self-Concept: Goal: Ability to maintain and perform role responsibilities to the fullest extent possible will improve Outcome: Progressing   Problem: Pain Management: Goal: Pain level will decrease Outcome: Progressing

## 2018-05-15 NOTE — Plan of Care (Signed)
  Problem: Spiritual Needs Goal: Ability to function at adequate level Outcome: Progressing   Problem: Education: Goal: Knowledge of General Education information will improve Description Including pain rating scale, medication(s)/side effects and non-pharmacologic comfort measures Outcome: Progressing   Problem: Health Behavior/Discharge Planning: Goal: Ability to manage health-related needs will improve Outcome: Progressing   Problem: Clinical Measurements: Goal: Ability to maintain clinical measurements within normal limits will improve Outcome: Progressing   Problem: Clinical Measurements: Goal: Will remain free from infection Outcome: Progressing

## 2018-05-15 NOTE — Progress Notes (Signed)
Patient ID: Darlene Petty, female   DOB: Feb 17, 1951, 67 y.o.   MRN: [id number] Subjective: 1 Day Post-Op Procedure(s) (LRB): TOTAL HIP ARTHROPLASTY ANTERIOR APPROACH (Right)    Patient reports pain as mild to moderate. No events.  Reviewed procedure and plans  Objective:   VITALS:   Vitals:   05/15/18 0259 05/15/18 0649  BP: 131/62 (!) 123/93  Pulse: 89 98  Resp: 16 16  Temp: 98.8 F (37.1 C) 98.4 F (36.9 C)  SpO2: 92% 99%    Neurovascular intact Incision: dressing C/D/I  LABS Recent Labs    05/14/18 0417 05/15/18 0416  HGB 13.2 12.0  HCT 40.3 35.9*  WBC 8.1 14.4*  PLT 244 233    Recent Labs    05/14/18 0417 05/15/18 0416  NA 141 138  K 4.1 4.4  BUN 14 9  CREATININE 0.66 0.57  GLUCOSE 168* 148*    No results for input(s): LABPT, INR in the last 72 hours.   Assessment/Plan: 1 Day Post-Op Procedure(s) (LRB): TOTAL HIP ARTHROPLASTY ANTERIOR APPROACH (Right)   Advance diet Up with therapy Plan for discharge tomorrow   WBAT RLE RTC in 2 weeks ASA for DVY prophylaxis No HHPT needed exercises reviewed Should have all DME at home after history of bilateral TKRs

## 2018-05-15 NOTE — Progress Notes (Signed)
[facility]  PROGRESS NOTE  Darlene Petty ZOX:[id number] DOB: 1951/05/04 DOA: 05/13/2018 PCP: System, Pcp Not In   Brief HPI:   67 year old femaleWith history of asthma, needed. came to hospital after mechanical fall.  X-ray showed closed right hip fracture.  Orthopedics has been consulted    Subjective   Patient seen and examined, status post right total hip arthroplasty.  Denies any pain   Assessment/Plan:     1. Closed right hip fracture-orthopedics was consulted, patient underwent right total hip arthroplasty.  Plan for discharge home in a.m.  2. Abnormal EKG-patient has no history of CAD, denies chest pain .  EKG showed nonspecific ST changes.    3. Asthma-continue PRN albuterol.  No acute exacerbation at this time.  4. Right ankle sprain-continue conservative management     CBG: No results for input(s): GLUCAP in the last 168 hours.  CBC: Recent Labs  Lab 05/14/18 0417 05/15/18 0416  WBC 8.1 14.4*  HGB 13.2 12.0  HCT 40.3 35.9*  MCV 91.6 90.4  PLT 244 233    Basic Metabolic Panel: Recent Labs  Lab 05/14/18 0417 05/15/18 0416  NA 141 138  K 4.1 4.4  CL 105 101  CO2 27 28  GLUCOSE 168* 148*  BUN 14 9  CREATININE 0.66 0.57  CALCIUM 8.7* 8.6*     DVT prophylaxis: Aspirin per orthopedics  Code Status: Full code  Family Communication: No family at bedside  Disposition Plan: likely home when medically ready for discharge   Consultants:  Orthopedics  Procedures:  None   Antibiotics:   Anti-infectives (From admission, onward)   Start     Dose/Rate Route Frequency Ordered Stop   05/14/18 1600  ceFAZolin (ANCEF) IVPB 1 g/50 mL premix     1 g 100 mL/hr over 30 Minutes Intravenous Every 6 hours 05/14/18 1406 05/14/18 2249   05/14/18 0948  ceFAZolin (ANCEF) 2-4 GM/100ML-% IVPB    Note to Pharmacy:  Juanda Crumble   : cabinet override      05/14/18 0948 05/14/18 2159   05/14/18 0915  ceFAZolin (ANCEF) IVPB 2g/100 mL premix     2  g 200 mL/hr over 30 Minutes Intravenous On call to O.R. 05/13/18 2031 05/14/18 1112       Objective   Vitals:   05/14/18 2134 05/15/18 0259 05/15/18 0649 05/15/18 0941  BP: (!) 142/88 131/62 (!) 123/93 116/69  Pulse: 91 89 98 84  Resp: 16 16 16    Temp: 98.9 F (37.2 C) 98.8 F (37.1 C) 98.4 F (36.9 C) 98.3 F (36.8 C)  TempSrc: Oral Oral Oral Oral  SpO2: 95% 92% 99% 97%  Weight:      Height:        Intake/Output Summary (Last 24 hours) at 05/15/2018 1234 Last data filed at 05/15/2018 0900 Gross per 24 hour  Intake 2371.47 ml  Output 5610 ml  Net -3238.53 ml   Filed Weights   05/14/18 0350  Weight: 86.2 kg     Physical Examination:    Mouth: Oral mucosa is moist, no lesions on palate,  Neck: Supple, no deformities, masses, or tenderness Lungs: Normal respiratory effort, bilateral clear to auscultation, no crackles or wheezes.  Heart: Regular rate and rhythm, S1 and S2 normal, no murmurs, rubs auscultated Abdomen: BS normoactive,soft,nondistended,non-tender to palpation,no organomegaly Extremities: No pretibial edema, no erythema, no cyanosis, no clubbing Neuro : Alert and oriented to time, place and person, No focal deficits     Data Reviewed: I have personally  reviewed following labs and imaging studies   Recent Results (from the past 240 hour(s))  MRSA PCR Screening     Status: None   Collection Time: 05/13/18  5:09 PM  Result Value Ref Range Status   MRSA by PCR NEGATIVE NEGATIVE Final    Comment:        The GeneXpert MRSA Assay (FDA approved for NASAL specimens only), is one component of a comprehensive MRSA colonization surveillance program. It is not intended to diagnose MRSA infection nor to guide or monitor treatment for MRSA infections. Performed at [facility], [address], [place], [place] [address]      Liver Function Tests: Recent Labs  Lab 05/13/18 1649  ALBUMIN 4.1      Studies: Dg Pelvis  Portable  Result Date: 05/14/2018 CLINICAL DATA:  Post RIGHT total hip replacement EXAM: PORTABLE PELVIS 1-2 VIEWS COMPARISON:  Portable exam 1229 hours compared to earlier intraoperative images of 05/14/2018 FINDINGS: Osseous demineralization. Components of a RIGHT hip prosthesis identified in expected position. No acute fracture, dislocation, or bone destruction. IMPRESSION: RIGHT hip prosthesis without acute complication. Electronically Signed   By: Ulyses Southward M.D.   On: 05/14/2018 13:51   Chest Portable 1 View  Result Date: 05/13/2018 CLINICAL DATA:  Larey Seat today, pain in right hip, known right hip fx per pt; pre op, no chest complaints, hx asthma; non smoker EXAM: PORTABLE CHEST 1 VIEW COMPARISON:  None. FINDINGS: Cardiac silhouette is normal in size. No mediastinal hilar masses. No evidence of adenopathy. Clear lungs.  No pleural effusion or pneumothorax. Skeletal structures are demineralized but grossly intact. IMPRESSION: No active disease. Electronically Signed   By: Amie Portland M.D.   On: 05/13/2018 18:24   Dg Ankle Right Port  Result Date: 05/13/2018 CLINICAL DATA:  Pain after fall.  Swelling. EXAM: PORTABLE RIGHT ANKLE - 2 VIEW COMPARISON:  None. FINDINGS: Soft tissue calcifications are seen distal to the fibula consistent with age-indeterminate avulsion injuries. Minimal irregularity of the distal medial malleolus, likely from age indeterminate avulsion injury as well. The remainder of the tibia and fibula are intact. The ankle mortise is intact. No other fractures. IMPRESSION: Irregularity of the medial malleolus and soft tissue calcifications distal to the fibula are likely from age indeterminate avulsion injuries. No other evidence of fracture or malalignment. Electronically Signed   By: Gerome Sam III M.D   On: 05/13/2018 21:09   Dg C-arm 1-60 Min-no Report  Result Date: 05/14/2018 Fluoroscopy was utilized by the requesting physician.  No radiographic interpretation.   Dg Hip  Operative Unilat With Pelvis Right  Result Date: 05/14/2018 CLINICAL DATA:  Right total hip arthroplasty EXAM: OPERATIVE RIGHT HIP (WITH PELVIS IF PERFORMED) 2 VIEWS TECHNIQUE: Fluoroscopic spot image(s) were submitted for interpretation post-operatively. COMPARISON:  05/13/2018 FINDINGS: Two intraoperative spot images demonstrate changes of right hip replacement. No hardware complicating feature. IMPRESSION: Right hip replacement.  No visible complicating feature. Electronically Signed   By: Charlett Nose M.D.   On: 05/14/2018 12:47   Dg Hip Unilat With Pelvis 2-3 Views Right  Result Date: 05/13/2018 CLINICAL DATA:  Larey Seat today, pain in right hip, known right hip fx per pt; pre op, no chest complaints, hx asthma; non smoker EXAM: DG HIP (WITH OR WITHOUT PELVIS) 2-3V RIGHT COMPARISON:  None. FINDINGS: Mid right femoral neck fracture. Fractures non comminuted. The distal fracture component has displaced superiorly by 2 cm. There is mild apex anterior angulation. No significant varus or valgus angulation. No other  fractures.  No bone lesions. Hip joints, SI joints and symphysis pubis are normally aligned Soft tissues are unremarkable. IMPRESSION: 1. Mildly displaced, non comminuted, right mid cervical femoral neck fracture. Electronically Signed   By: Amie Portland M.D.   On: 05/13/2018 18:25    Scheduled Meds: . aspirin  81 mg Oral BID  . dexamethasone  10 mg Intravenous Once  . docusate sodium  100 mg Oral BID  . ferrous sulfate  325 mg Oral BID WC  . pantoprazole  40 mg Oral Daily      Time spent: 25 min  Meredeth Ide   [facility] Pager [phone]. If 7PM-7AM, please contact night-coverage at [email or web address], Office  [phone]  password [email or web address]  05/15/2018, 12:34 PM  LOS: 2 days

## 2018-05-15 NOTE — Evaluation (Signed)
Physical Therapy Evaluation Patient Details Name: Darlene Petty MRN: [id number] DOB: [birth year] Today's Date: 05/15/2018   History of Present Illness  67 y.o. female admitted with a fall, R hip fx, s/p R DA-THA. Pt also reports R ankle sprain.  PMH B TKA.   Clinical Impression  Pt is s/p THA resulting in the deficits listed below (see PT Problem List). Pt ambulated 68' with RW. Good progress expected.  Pt will benefit from skilled PT to increase their independence and safety with mobility to allow discharge to the venue listed below.      Follow Up Recommendations Follow surgeon's recommendation for DC plan and follow-up therapies    Equipment Recommendations  None recommended by PT    Recommendations for Other Services       Precautions / Restrictions Precautions Precautions: Fall Restrictions Weight Bearing Restrictions: No RLE Weight Bearing: Weight bearing as tolerated      Mobility  Bed Mobility Overal bed mobility: Needs Assistance Bed Mobility: Supine to Sit     Supine to sit: Min assist     General bed mobility comments: assist for RLE  Transfers Overall transfer level: Needs assistance Equipment used: Rolling walker (2 wheeled) Transfers: Sit to/from Stand Sit to Stand: Min guard;From elevated surface         General transfer comment: VCs hand placement  Ambulation/Gait   Gait Distance (Feet): 26 Feet Assistive device: Rolling walker (2 wheeled) Gait Pattern/deviations: Step-to pattern;Decreased step length - right;Decreased step length - left;Antalgic;Decreased weight shift to right Gait velocity: decr   General Gait Details: VCs sequencing with RW, distance limited by pain  Stairs            Wheelchair Mobility    Modified Rankin (Stroke Patients Only)       Balance Overall balance assessment: Modified Independent                                           Pertinent Vitals/Pain Pain Assessment: 0-10 Pain  Score: 5  Pain Location:  R hip Pain Descriptors / Indicators: Sore Pain Intervention(s): Limited activity within patient's tolerance;Monitored during session;Premedicated before session;Ice applied    Home Living Family/patient expects to be discharged to:: Private residence Living Arrangements: Spouse/significant other Available Help at Discharge: Family Type of Home: House Home Access: Stairs to enter Entrance Stairs-Rails: None Entrance Stairs-Number of Steps: 1 Home Layout: One level;Laundry or work area in [employer] Equipment: Environmental consultant - 2 wheels;Cane - single point      Prior Function Level of Independence: Independent               Hand Dominance        Extremity/Trunk Assessment   Upper Extremity Assessment Upper Extremity Assessment: Overall WFL for tasks assessed    Lower Extremity Assessment Lower Extremity Assessment: RLE deficits/detail RLE Deficits / Details: knee ext -3/5, ankle not fully tested 2* pain from sprain, sensation intact to light touch    Cervical / Trunk Assessment Cervical / Trunk Assessment: Normal  Communication   Communication: No difficulties  Cognition Arousal/Alertness: Awake/alert Behavior During Therapy: WFL for tasks assessed/performed Overall Cognitive Status: Within Functional Limits for tasks assessed  General Comments      Exercises Total Joint Exercises Ankle Circles/Pumps: AROM;Both;10 reps;Supine Long Arc Quad: AROM;Right;5 reps;Seated   Assessment/Plan    PT Assessment Patient needs continued PT services  PT Problem List Decreased strength;Decreased range of motion;Decreased activity tolerance;Decreased mobility;Decreased balance;Pain       PT Treatment Interventions DME instruction;Gait training;Stair training;Therapeutic exercise;Therapeutic activities;Functional mobility training;Patient/family education    PT Goals (Current goals can be found  in the Care Plan section)  Acute Rehab PT Goals Patient Stated Goal: return to independence with mobility PT Goal Formulation: With patient/family Time For Goal Achievement: 05/22/18 Potential to Achieve Goals: Good    Frequency 7X/week   Barriers to discharge        Co-evaluation               AM-PAC PT "6 Clicks" Daily Activity  Outcome Measure Difficulty turning over in bed (including adjusting bedclothes, sheets and blankets)?: A Lot Difficulty moving from lying on back to sitting on the side of the bed? : Unable Difficulty sitting down on and standing up from a chair with arms (e.g., wheelchair, bedside commode, etc,.)?: A Lot Help needed moving to and from a bed to chair (including a wheelchair)?: A Little Help needed walking in hospital room?: A Little Help needed climbing 3-5 steps with a railing? : A Lot 6 Click Score: 13    End of Session Equipment Utilized During Treatment: Gait belt Activity Tolerance: Patient limited by pain;Patient limited by fatigue Patient left: in bed;with call bell/phone within reach;with family/visitor present Nurse Communication: Mobility status PT Visit Diagnosis: Difficulty in walking, not elsewhere classified (R26.2);Pain;History of falling (Z91.81) Pain - Right/Left: Right Pain - part of body: Hip    Time: 1110-1130 PT Time Calculation (min) (ACUTE ONLY): 20 min   Charges:   PT Evaluation $PT Eval Low Complexity: 1 Low          Ralene Bathe Kistler PT 05/15/2018  Acute Rehabilitation Services Pager [phone] Office [phone]

## 2018-05-15 NOTE — Progress Notes (Signed)
  Echocardiogram 2D Echocardiogram has been performed.  Celene Skeen 05/15/2018, 4:27 PM

## 2018-05-16 MED ORDER — HYDROCODONE-ACETAMINOPHEN 5-325 MG PO TABS: 1.0000 | ORAL_TABLET | ORAL | 0 refills | Status: DC | PRN

## 2018-05-16 MED ORDER — METHOCARBAMOL 500 MG PO TABS: 500.0000 mg | ORAL_TABLET | Freq: Four times a day (QID) | ORAL | 0 refills | Status: DC | PRN

## 2018-05-16 NOTE — Discharge Summary (Signed)
Physician Discharge Summary  Darlene Petty ZOX:[id number] DOB: Oct 26, 1950 DOA: 05/13/2018  PCP: System, Pcp Not In  Admit date: 05/13/2018 Discharge date: 05/16/2018  Time spent: 40* minutes  Recommendations for Outpatient Follow-up:  1. Follow up Dr Charlann Boxer in 2 weeks   Discharge Diagnoses:  Active Problems:   Closed right hip fracture [facility])   Discharge Condition: Stable  Diet recommendation: Regular diet  Filed Weights   05/14/18 0350  Weight: 86.2 kg    History of present illness:  67 year old femaleWith history of asthma, needed. came to hospital after mechanical fall.  X-ray showed closed right hip fracture.  Orthopedics has been consulted   Hospital Course:  1. Closed right hip fracture-orthopedics was consulted, patient underwent right total hip arthroplasty.  Plan for discharge home. No HHPt recommended  2. Abnormal EKG-patient has no history of CAD, denies chest pain .  EKG showed nonspecific ST changes. Echo shows no WMA, EF 55-60%    3. Asthma-continue PRN albuterol.  No acute exacerbation at this time.  4. Right ankle sprain-continue conservative management  Procedures: S/p total hip arthroplasty  Consultations:  Orthopedics  Discharge Exam: Vitals:   05/15/18 2300 05/16/18 0610  BP: 108/65 113/75  Pulse: 84 71  Resp: 18 17  Temp: 98.4 F (36.9 C) 97.6 F (36.4 C)  SpO2: 96% 96%    General: Appears in no acute distress Cardiovascular: S1S2 RRR Respiratory: Clear bilaterally  Discharge Instructions   Discharge Instructions    Diet - low sodium heart healthy   Complete by:  As directed    Increase activity slowly   Complete by:  As directed      Allergies as of 05/16/2018   No Known Allergies     Medication List    STOP taking these medications   aspirin 325 MG tablet   celecoxib 200 MG capsule Commonly known as:  CELEBREX   HYDROcodone-acetaminophen 7.5-325 MG tablet Commonly known as:  NORCO Replaced by:   HYDROcodone-acetaminophen 5-325 MG tablet     TAKE these medications   albuterol 108 (90 Base) MCG/ACT inhaler Commonly known as:  PROVENTIL HFA;VENTOLIN HFA Inhale 1-2 puffs into the lungs every 6 (six) hours as needed for wheezing or shortness of breath.   cetirizine 10 MG tablet Commonly known as:  ZYRTEC Take 10 mg by mouth as needed for allergies.   docusate sodium 100 MG capsule Commonly known as:  COLACE Take 1 capsule (100 mg total) by mouth 2 (two) times daily. What changed:    when to take this  reasons to take this   EYE ALLERGY RELIEF OP Apply 1 drop to eye 2 (two) times daily as needed (Eye allergies).   ferrous sulfate 325 (65 FE) MG tablet Take 1 tablet (325 mg total) by mouth 3 (three) times daily after meals.   HYDROcodone-acetaminophen 5-325 MG tablet Commonly known as:  NORCO/VICODIN Take 1-2 tablets by mouth every 4 (four) hours as needed for moderate pain (pain score 4-6). Replaces:  HYDROcodone-acetaminophen 7.5-325 MG tablet   methocarbamol 500 MG tablet Commonly known as:  ROBAXIN Take 1 tablet (500 mg total) by mouth every 6 (six) hours as needed for muscle spasms.   multivitamin with minerals Tabs tablet Take 1 tablet by mouth daily.   polyethylene glycol packet Commonly known as:  MIRALAX / GLYCOLAX Take 17 g by mouth 2 (two) times daily.      No Known Allergies Follow-up Information    Durene Romans, MD Follow up.   Specialty:  Orthopedic Surgery Contact information: [address] [place] [address] [phone]            The results of significant diagnostics from this hospitalization (including imaging, microbiology, ancillary and laboratory) are listed below for reference.    Significant Diagnostic Studies: Dg Pelvis Portable  Result Date: 05/14/2018 CLINICAL DATA:  Post RIGHT total hip replacement EXAM: PORTABLE PELVIS 1-2 VIEWS COMPARISON:  Portable exam 1229 hours compared to earlier intraoperative  images of 05/14/2018 FINDINGS: Osseous demineralization. Components of a RIGHT hip prosthesis identified in expected position. No acute fracture, dislocation, or bone destruction. IMPRESSION: RIGHT hip prosthesis without acute complication. Electronically Signed   By: Ulyses Southward M.D.   On: 05/14/2018 13:51   Chest Portable 1 View  Result Date: 05/13/2018 CLINICAL DATA:  Larey Seat today, pain in right hip, known right hip fx per pt; pre op, no chest complaints, hx asthma; non smoker EXAM: PORTABLE CHEST 1 VIEW COMPARISON:  None. FINDINGS: Cardiac silhouette is normal in size. No mediastinal hilar masses. No evidence of adenopathy. Clear lungs.  No pleural effusion or pneumothorax. Skeletal structures are demineralized but grossly intact. IMPRESSION: No active disease. Electronically Signed   By: Amie Portland M.D.   On: 05/13/2018 18:24   Dg Ankle Right Port  Result Date: 05/13/2018 CLINICAL DATA:  Pain after fall.  Swelling. EXAM: PORTABLE RIGHT ANKLE - 2 VIEW COMPARISON:  None. FINDINGS: Soft tissue calcifications are seen distal to the fibula consistent with age-indeterminate avulsion injuries. Minimal irregularity of the distal medial malleolus, likely from age indeterminate avulsion injury as well. The remainder of the tibia and fibula are intact. The ankle mortise is intact. No other fractures. IMPRESSION: Irregularity of the medial malleolus and soft tissue calcifications distal to the fibula are likely from age indeterminate avulsion injuries. No other evidence of fracture or malalignment. Electronically Signed   By: Gerome Sam III M.D   On: 05/13/2018 21:09   Dg C-arm 1-60 Min-no Report  Result Date: 05/14/2018 Fluoroscopy was utilized by the requesting physician.  No radiographic interpretation.   Dg Hip Operative Unilat With Pelvis Right  Result Date: 05/14/2018 CLINICAL DATA:  Right total hip arthroplasty EXAM: OPERATIVE RIGHT HIP (WITH PELVIS IF PERFORMED) 2 VIEWS TECHNIQUE: Fluoroscopic  spot image(s) were submitted for interpretation post-operatively. COMPARISON:  05/13/2018 FINDINGS: Two intraoperative spot images demonstrate changes of right hip replacement. No hardware complicating feature. IMPRESSION: Right hip replacement.  No visible complicating feature. Electronically Signed   By: Charlett Nose M.D.   On: 05/14/2018 12:47   Dg Hip Unilat With Pelvis 2-3 Views Right  Result Date: 05/13/2018 CLINICAL DATA:  Larey Seat today, pain in right hip, known right hip fx per pt; pre op, no chest complaints, hx asthma; non smoker EXAM: DG HIP (WITH OR WITHOUT PELVIS) 2-3V RIGHT COMPARISON:  None. FINDINGS: Mid right femoral neck fracture. Fractures non comminuted. The distal fracture component has displaced superiorly by 2 cm. There is mild apex anterior angulation. No significant varus or valgus angulation. No other fractures.  No bone lesions. Hip joints, SI joints and symphysis pubis are normally aligned Soft tissues are unremarkable. IMPRESSION: 1. Mildly displaced, non comminuted, right mid cervical femoral neck fracture. Electronically Signed   By: Amie Portland M.D.   On: 05/13/2018 18:25    Microbiology: Recent Results (from the past 240 hour(s))  MRSA PCR Screening     Status: None   Collection Time: 05/13/18  5:09 PM  Result Value Ref Range Status  MRSA by PCR NEGATIVE NEGATIVE Final    Comment:        The GeneXpert MRSA Assay (FDA approved for NASAL specimens only), is one component of a comprehensive MRSA colonization surveillance program. It is not intended to diagnose MRSA infection nor to guide or monitor treatment for MRSA infections. Performed at [facility], [address] [place], [place] [address]      Labs: Basic Metabolic Panel: Recent Labs  Lab 05/14/18 0417 05/15/18 0416  NA 141 138  K 4.1 4.4  CL 105 101  CO2 27 28  GLUCOSE 168* 148*  BUN 14 9  CREATININE 0.66 0.57  CALCIUM 8.7* 8.6*   Liver Function Tests: Recent Labs   Lab 05/13/18 1649  ALBUMIN 4.1   No results for input(s): LIPASE, AMYLASE in the last 168 hours. No results for input(s): AMMONIA in the last 168 hours. CBC: Recent Labs  Lab 05/14/18 0417 05/15/18 0416  WBC 8.1 14.4*  HGB 13.2 12.0  HCT 40.3 35.9*  MCV 91.6 90.4  PLT 244 233      Signed:  Meredeth Ide MD.  [facility] 05/16/2018, 10:46 AM

## 2018-05-16 NOTE — Care Management Note (Signed)
Case Management Note  Patient Details  Name: Darlene Petty MRN: [id number] Date of Birth: 1950/10/03  Subjective/Objective:   Spoke with patient and spouse at bedside. Confirmed plan for HEP. Has RW and 3n1. [phone]                 Action/Plan: No needs, spouse will transport home  Expected Discharge Date:                  Expected Discharge Plan:  Home/Self Care  In-House Referral:  NA  Discharge planning Services  CM Consult  Post Acute Care Choice:  NA Choice offered to:  Patient, Spouse  DME Arranged:  N/A DME Agency:  NA  HH Arranged:  NA HH Agency:  NA  Status of Service:  Completed, signed off  If discussed at Long Length of Stay Meetings, dates discussed:    Additional Comments:  Alexis Goodell, RN 05/16/2018, 9:16 AM

## 2018-05-16 NOTE — Progress Notes (Signed)
Patient ID: Darlene Petty, female   DOB: 02-04-1951, 67 y.o.   MRN: [id number] Subjective: 2 Days Post-Op Procedure(s) (LRB): TOTAL HIP ARTHROPLASTY ANTERIOR APPROACH (Right)    Patient reports pain as mild to moderate.  Very pleasant disposition.  Right ankle sore with weight bearing (sprain)  Objective:   VITALS:   Vitals:   05/15/18 2300 05/16/18 0610  BP: 108/65 113/75  Pulse: 84 71  Resp: 18 17  Temp: 98.4 F (36.9 C) 97.6 F (36.4 C)  SpO2: 96% 96%    Neurovascular intact Incision: dressing C/D/I  LABS Recent Labs    05/14/18 0417 05/15/18 0416  HGB 13.2 12.0  HCT 40.3 35.9*  WBC 8.1 14.4*  PLT 244 233    Recent Labs    05/14/18 0417 05/15/18 0416  NA 141 138  K 4.1 4.4  BUN 14 9  CREATININE 0.66 0.57  GLUCOSE 168* 148*    No results for input(s): LABPT, INR in the last 72 hours.   Assessment/Plan: 2 Days Post-Op Procedure(s) (LRB): TOTAL HIP ARTHROPLASTY ANTERIOR APPROACH (Right)   Up with therapy  Home today after therapy RTC in 2 weeks ASA for DVT prophylaxis Norco for pain

## 2018-05-16 NOTE — Progress Notes (Signed)
Physical Therapy Treatment Patient Details Name: Darlene Petty MRN: [id number] DOB: August 29, 1950 Today's Date: 05/16/2018    History of Present Illness 67 y.o. female admitted with a fall, R hip fx, s/p R DA-THA. Pt also reports R ankle sprain.  PMH B TKA.     PT Comments    Pt tolerated increased ambulation distance of 22' with RW, she reported R ankle more painful than hip with walking and that ankle feels like its "rolling in". Issued aircast ankle brace and instructed pt/spouse in donning it. Stair training completed. Pt reports independence with HEP. From PT standpoint, she is ready to DC home.    Follow Up Recommendations  Follow surgeon's recommendation for DC plan and follow-up therapies     Equipment Recommendations  None recommended by PT    Recommendations for Other Services       Precautions / Restrictions Precautions Precautions: Fall Restrictions Weight Bearing Restrictions: No RLE Weight Bearing: Weight bearing as tolerated    Mobility  Bed Mobility               General bed mobility comments: up in recliner  Transfers Overall transfer level: Needs assistance Equipment used: Rolling walker (2 wheeled) Transfers: Sit to/from Stand Sit to Stand: Min guard;From elevated surface         General transfer comment: VCs hand placement  Ambulation/Gait Ambulation/Gait assistance: Min guard Gait Distance (Feet): 80 Feet Assistive device: Rolling walker (2 wheeled) Gait Pattern/deviations: Step-to pattern;Decreased step length - right;Decreased step length - left;Antalgic;Decreased weight shift to right Gait velocity: decr   General Gait Details: VCs sequencing with RW, distance limited by pain, R ankle more painful than hip, pt reports feeling like R ankle is rolling in, will get ankle brace for pt   Stairs Stairs: Yes Stairs assistance: Min assist Stair Management: No rails;Backwards;Step to pattern;With walker Number of Stairs: 1 General stair  comments: 1 step x 2 trials with spouse assisting with management of RW   Wheelchair Mobility    Modified Rankin (Stroke Patients Only)       Balance Overall balance assessment: Modified Independent                                          Cognition Arousal/Alertness: Awake/alert Behavior During Therapy: WFL for tasks assessed/performed Overall Cognitive Status: Within Functional Limits for tasks assessed                                        Exercises      General Comments        Pertinent Vitals/Pain Pain Score: 5  Pain Location: R ankle with walking, 3/10 R hip walking Pain Descriptors / Indicators: Sore Pain Intervention(s): Limited activity within patient's tolerance;Monitored during session;Ice applied(pt declined pain meds)    Home Living                      Prior Function            PT Goals (current goals can now be found in the care plan section) Acute Rehab PT Goals Patient Stated Goal: return to independence with mobility PT Goal Formulation: With patient/family Time For Goal Achievement: 05/22/18 Potential to Achieve Goals: Good Progress towards PT goals: Progressing toward goals    Frequency  7X/week      PT Plan Current plan remains appropriate    Co-evaluation              AM-PAC PT "6 Clicks" Daily Activity  Outcome Measure  Difficulty turning over in bed (including adjusting bedclothes, sheets and blankets)?: A Little Difficulty moving from lying on back to sitting on the side of the bed? : A Little Difficulty sitting down on and standing up from a chair with arms (e.g., wheelchair, bedside commode, etc,.)?: A Little Help needed moving to and from a bed to chair (including a wheelchair)?: A Little Help needed walking in hospital room?: A Little Help needed climbing 3-5 steps with a railing? : A Little 6 Click Score: 18    End of Session Equipment Utilized During Treatment:  Gait belt Activity Tolerance: Patient tolerated treatment well Patient left: with call bell/phone within reach;with family/visitor present;in chair Nurse Communication: Mobility status PT Visit Diagnosis: Difficulty in walking, not elsewhere classified (R26.2);Pain;History of falling (Z91.81) Pain - Right/Left: Right Pain - part of body: Hip     Time: 1610-9604 PT Time Calculation (min) (ACUTE ONLY): 45 min  Charges:  $Gait Training: 8-22 mins $Therapeutic Exercise: 8-22 mins $Therapeutic Activity: 8-22 mins                     Ralene Bathe Kistler PT 05/16/2018  Acute Rehabilitation Services Pager [phone] Office [phone]

## 2018-05-16 NOTE — Care Management Important Message (Signed)
Important Message  Patient Details  Name: Darlene Petty MRN: [id number] Date of Birth: 01/26/1951   Medicare Important Message Given:  Yes    Caren Macadam 05/16/2018, 12:04 PMImportant Message  Patient Details  Name: Darlene Petty MRN: [id number] Date of Birth: Dec 25, 1950   Medicare Important Message Given:  Yes    Caren Macadam 05/16/2018, 12:03 PM

## 2019-09-04 IMAGING — DX DG ANKLE PORT 2V*R*
3 series · 3 of 3 positions shown · non-contrast
Comparison: None.

CLINICAL DATA: Pain after fall.  Swelling.

EXAM:
PORTABLE RIGHT ANKLE - 2 VIEW

[ankle ap]
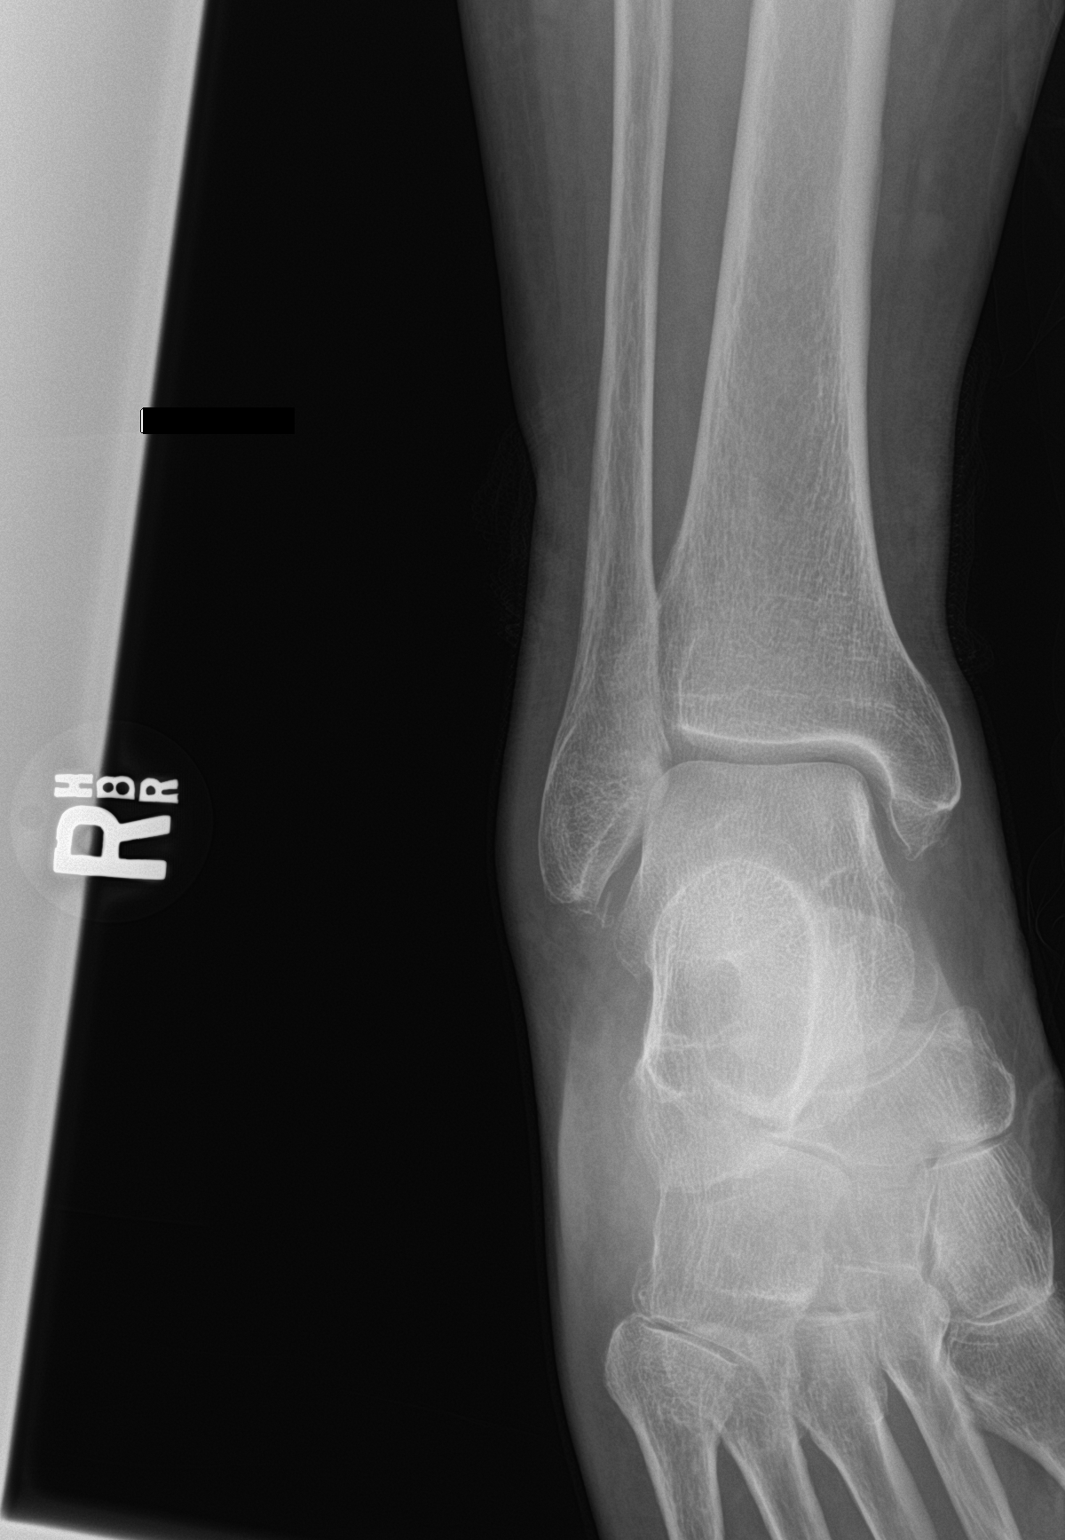

[ankle obl (1 of 2)]
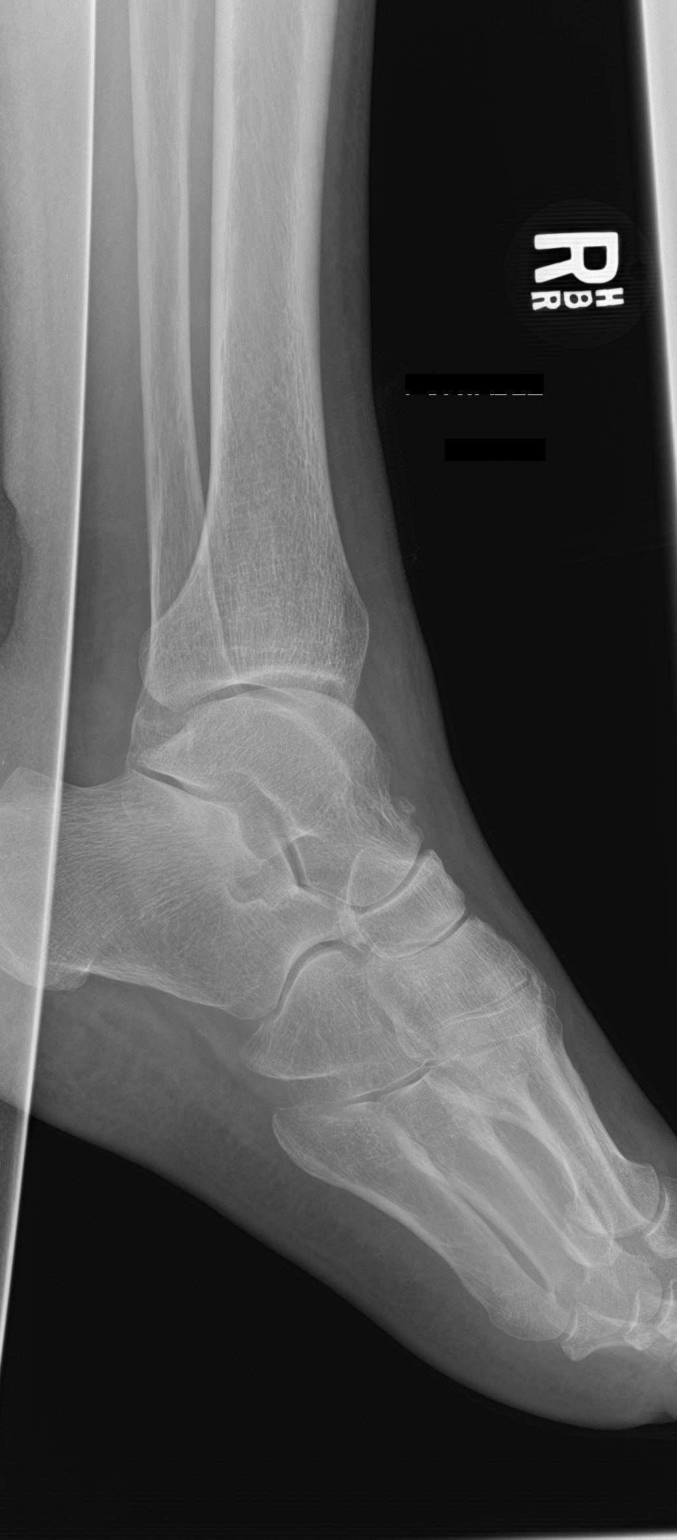

[ankle obl (2 of 2)]
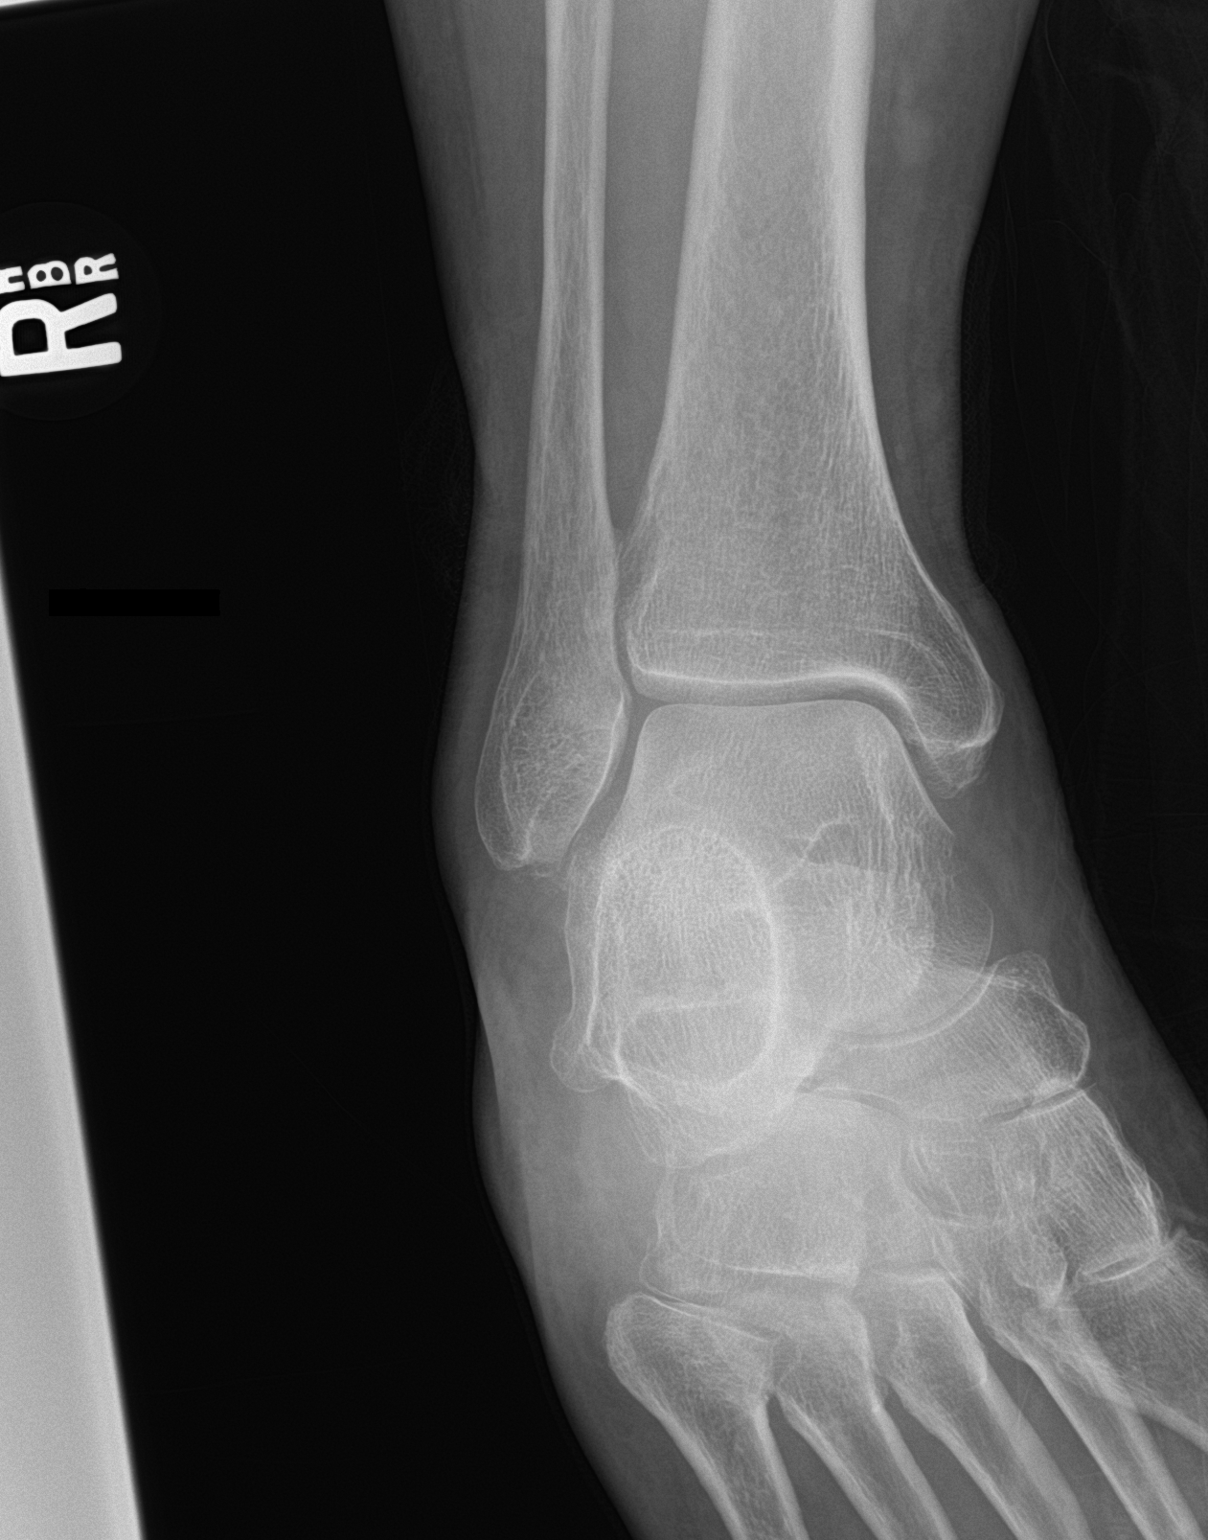

[3 of 3 positions shown; findings below may reference images not displayed]

FINDINGS: Soft tissue calcifications are seen distal to the fibula consistent
with age-indeterminate avulsion injuries. Minimal irregularity of
the distal medial malleolus, likely from age indeterminate avulsion
injury as well. The remainder of the tibia and fibula are intact.
The ankle mortise is intact. No other fractures.
IMPRESSION: Irregularity of the medial malleolus and soft tissue calcifications
distal to the fibula are likely from age indeterminate avulsion
injuries. No other evidence of fracture or malalignment.

## 2019-09-05 IMAGING — DX DG PORTABLE PELVIS
1 series · 1 of 1 positions shown · non-contrast
Comparison: Portable exam 4992 hours compared to earlier
intraoperative images of 05/14/2018

CLINICAL DATA: Post RIGHT total hip replacement

EXAM:
PORTABLE PELVIS 1-2 VIEWS

[pelvis ap]
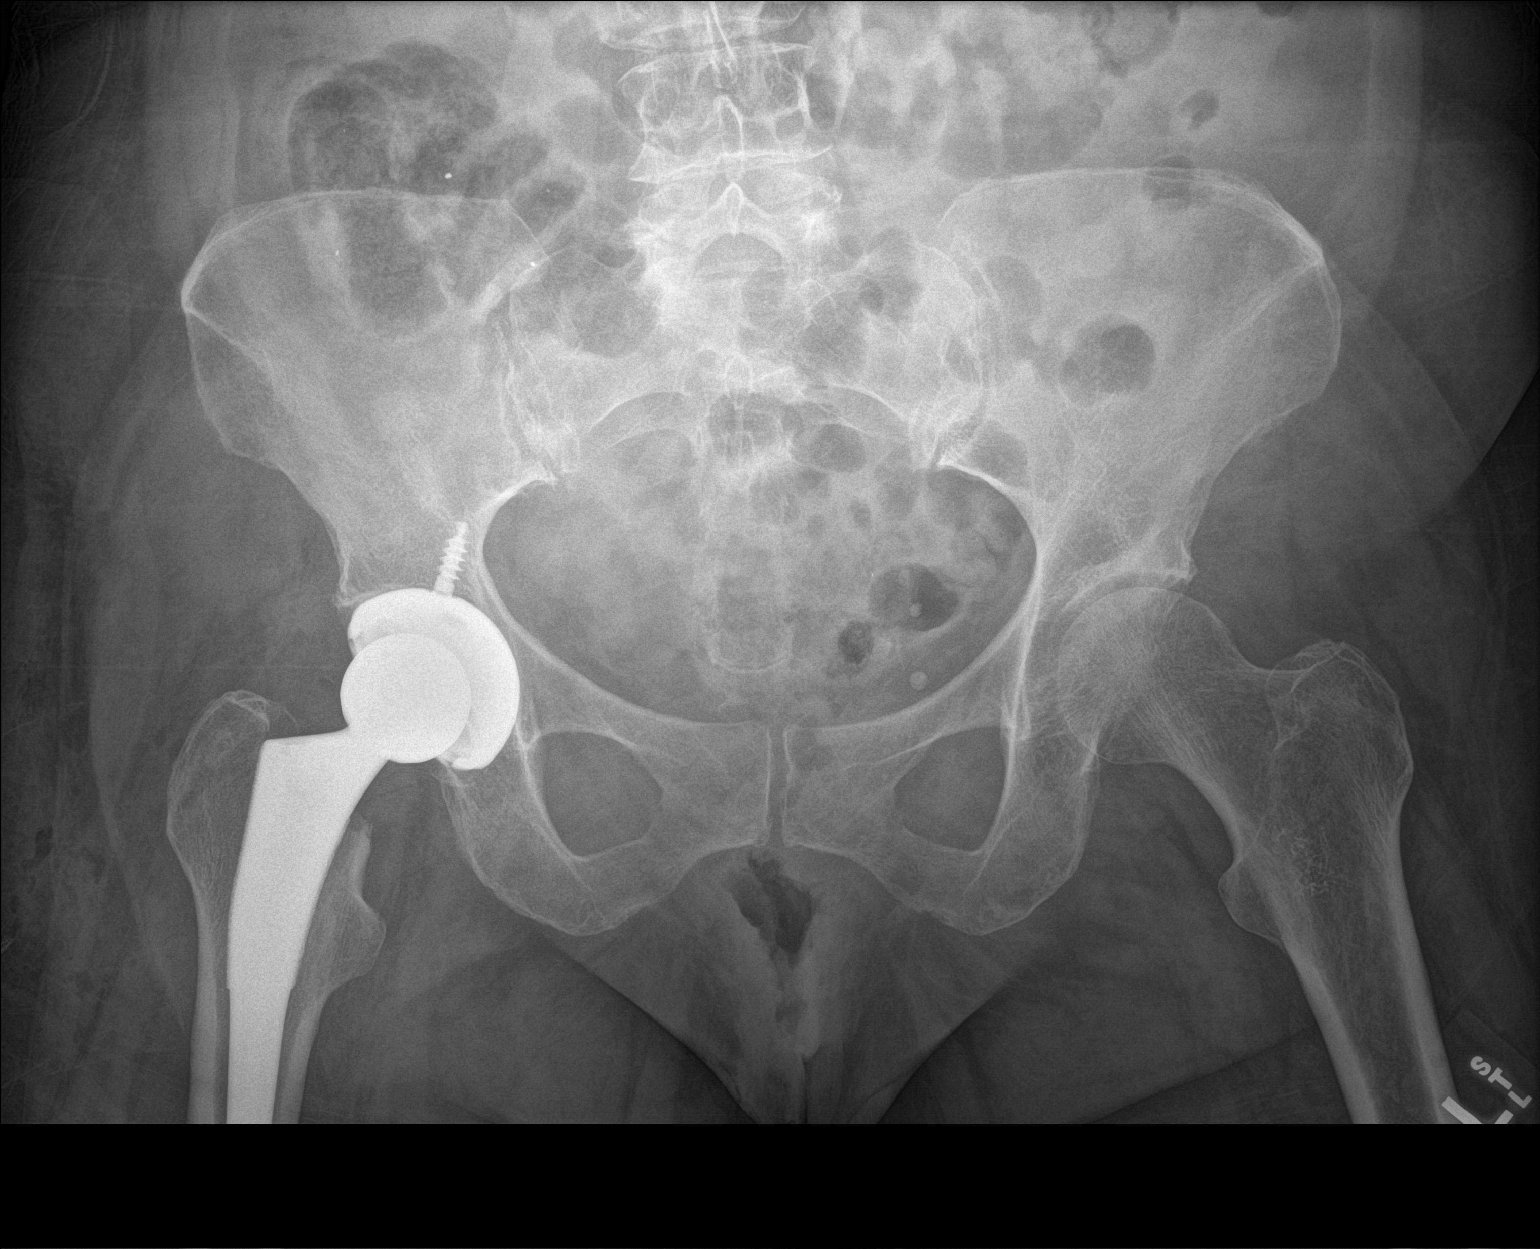

[1 of 1 positions shown; findings below may reference images not displayed]

FINDINGS: Osseous demineralization.

Components of a RIGHT hip prosthesis identified in expected
position.

No acute fracture, dislocation, or bone destruction.
IMPRESSION: RIGHT hip prosthesis without acute complication.

## 2019-09-05 IMAGING — RF DG HIP (WITH PELVIS) OPERATIVE*R*
1 series · 2 of 2 positions shown · non-contrast
Comparison: 05/13/2018

CLINICAL DATA: Right total hip arthroplasty

EXAM:
OPERATIVE RIGHT HIP (WITH PELVIS IF PERFORMED) 2 VIEWS
TECHNIQUE: Fluoroscopic spot image(s) were submitted for interpretation
post-operatively.

[Series 1: unknown protocol · 0.20mm/px · 2 of 2 slices shown]
[im 1/2]
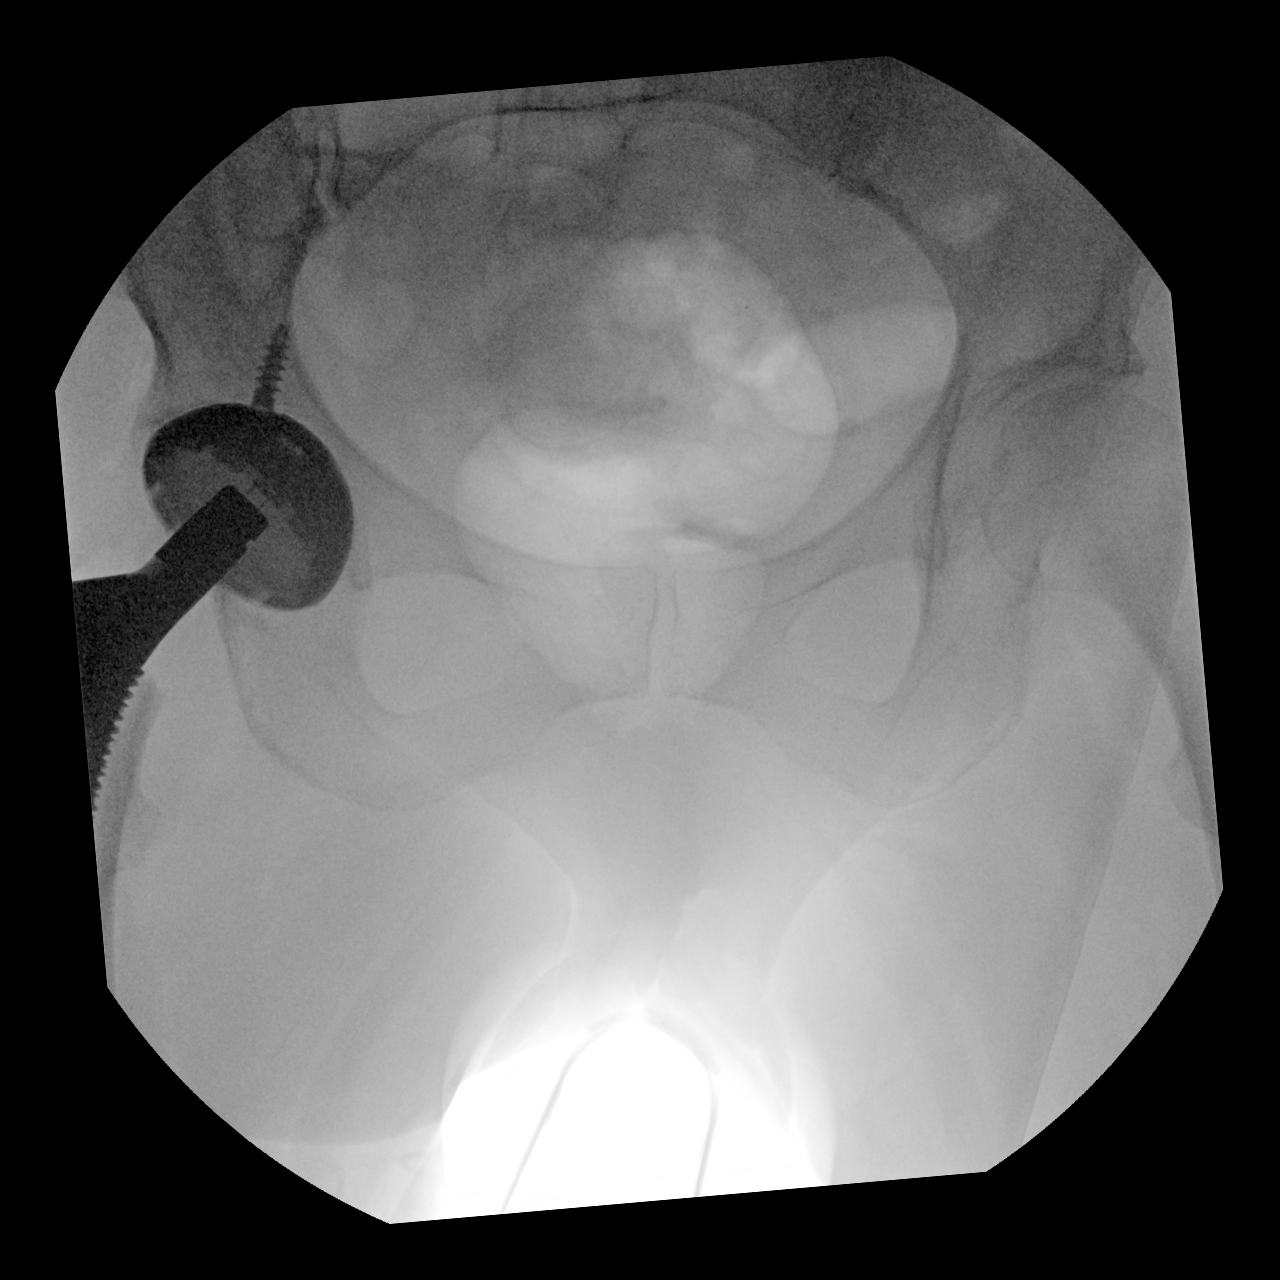
[im 2/2]
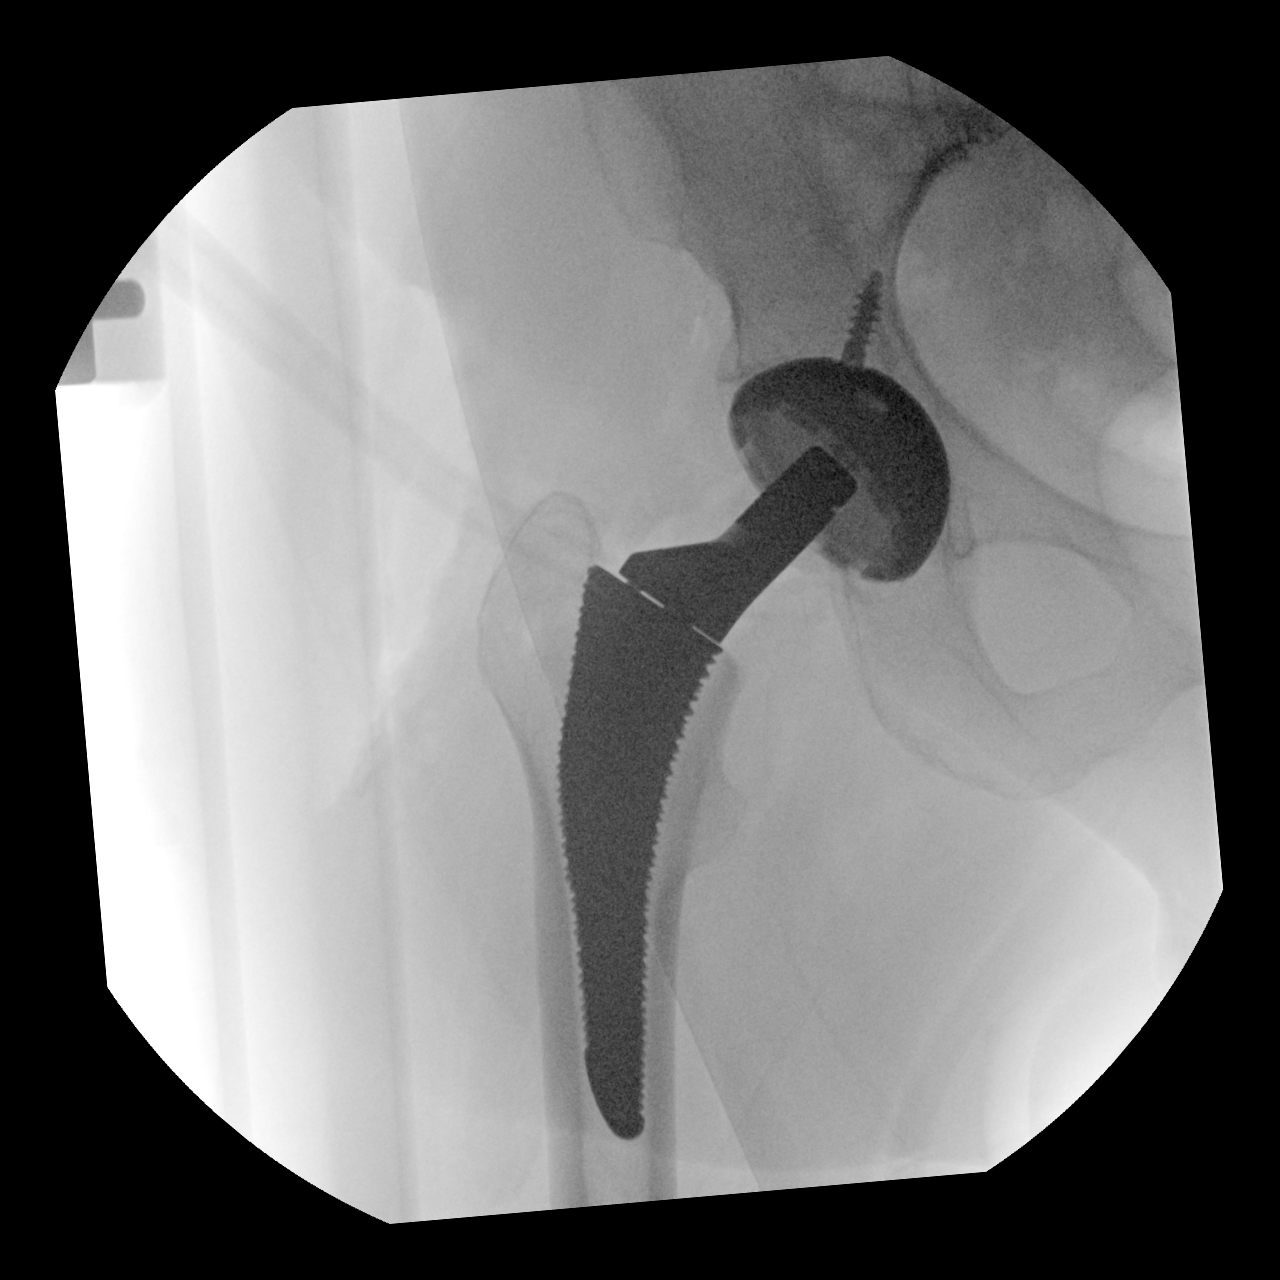

[2 of 2 positions shown; findings below may reference images not displayed]

FINDINGS: Two intraoperative spot images demonstrate changes of right hip
replacement. No hardware complicating feature.
IMPRESSION: Right hip replacement.  No visible complicating feature.

## 2022-01-20 ENCOUNTER — Ambulatory Visit (HOSPITAL_COMMUNITY)
Admission: RE | Admit: 2022-01-20 | Discharge: 2022-01-20 | Disposition: A | Discharge status: Home / Self Care | Payer: Medicare Other | Source: Ambulatory Visit | Attending: Allergy & Immunology | Admitting: Allergy & Immunology

## 2022-01-20 ENCOUNTER — Encounter: Payer: Self-pay | Admitting: Allergy & Immunology

## 2022-01-20 ENCOUNTER — Ambulatory Visit (INDEPENDENT_AMBULATORY_CARE_PROVIDER_SITE_OTHER): Payer: Medicare Other | Admitting: Allergy & Immunology

## 2022-01-20 VITALS — BP 136/76 | HR 87 | Temp 97.3°F | Resp 20 | Ht 61.0 in | Wt 176.2 lb

## 2022-01-20 DIAGNOSIS — J4540 Moderate persistent asthma, uncomplicated: Secondary | ICD-10-CM | POA: Diagnosis not present

## 2022-01-20 DIAGNOSIS — R053 Chronic cough: Secondary | ICD-10-CM

## 2022-01-20 DIAGNOSIS — J310 Chronic rhinitis: Secondary | ICD-10-CM | POA: Diagnosis not present

## 2022-01-20 DIAGNOSIS — J4541 Moderate persistent asthma with (acute) exacerbation: Secondary | ICD-10-CM

## 2022-01-20 DIAGNOSIS — R112 Nausea with vomiting, unspecified: Secondary | ICD-10-CM | POA: Diagnosis not present

## 2022-01-20 MED ORDER — OMEPRAZOLE 40 MG PO CPDR: 40.0000 mg | DELAYED_RELEASE_CAPSULE | Freq: Every day | ORAL | 3 refills | Status: DC

## 2022-01-20 MED ORDER — BREZTRI AEROSPHERE 160-9-4.8 MCG/ACT IN AERO: 2.0000 | INHALATION_SPRAY | Freq: Two times a day (BID) | RESPIRATORY_TRACT | 3 refills | Status: DC

## 2022-01-20 MED ORDER — METHYLPREDNISOLONE ACETATE 80 MG/ML IJ SUSP
80.0000 mg | Freq: Once | INTRAMUSCULAR | Status: AC
  Administered 2022-01-20: 80 mg via INTRAMUSCULAR

## 2022-01-20 MED ORDER — FLUTICASONE PROPIONATE 50 MCG/ACT NA SUSP: 2.0000 | Freq: Every day | NASAL | 5 refills | Status: AC

## 2022-01-20 NOTE — Patient Instructions (Addendum)
1. Moderate persistent asthma with acute exacerbation - Lung testing was in the low 70% range and improved slightly with the albuterol treatment. - We are starting you on a prednisone taper and we gave you a steroid injection to kick start the healing process. - Sample of Breztri provided (contains three medications to help keep inflammation in the lungs under control. - Spacer sample and demonstration provided. - We are also getting a chest X-ray to make sure that we are not dealing with a pneumonia.  - Daily controller medication(s): Breztri two puffs twice daily with spacer  - Prior to physical activity: albuterol 2 puffs 10-15 minutes before physical activity. - Rescue medications: albuterol 4 puffs every 4-6 hours as needed - Asthma control goals:  * Full participation in all desired activities (may need albuterol before activity) * Albuterol use two time or less a week on average (not counting use with activity) * Cough interfering with sleep two time or less a month * Oral steroids no more than once a year * No hospitalizations  2. Chronic rhinitis - Because of your difficulty breathing today, we are not going to do skin testing. - We will get some blood work instead. - Continue with cetirizine 10mg , but increase to TWICE DAILY for now. - Add on Flonase one spray per nostril daily.   3. Nausea and vomiting - A lot of this might be related to post-tussive emesis (due to the force of coughing), but we are going to start a proton pump inhibitor, if only temporarily, for treatment of an coexisting GERD.  - This will not be a long term medication, I don't think. - Prednisone can flare up reflux, though, so it would be a good idea to be on it temporarily.   4. Return in about 6 weeks (around 03/03/2022).    Please inform 03/05/2022 of any Emergency Department visits, hospitalizations, or changes in symptoms. Call us before going to the ED for breathing or allergy symptoms since we might be able  to fit you in for a sick visit. Feel free to contact us anytime with any questions, problems, or concerns.  It was a pleasure to meet you and your family today!  Websites that have reliable patient information: 1. American Academy of Asthma, Allergy, and Immunology: www.aaaai.org 2. Food Allergy Research and Education (FARE): foodallergy.org 3. Mothers of Asthmatics: http://www.asthmacommunitynetwork.org 4. American College of Allergy, Asthma, and Immunology: www.acaai.org   COVID-19 Vaccine Information can be found at: [place] For questions related to vaccine distribution or appointments, please email vaccine@ [email or web address] or call [phone].   We realize that you might be concerned about having an allergic reaction to the COVID19 vaccines. To help with that concern, WE ARE OFFERING THE COVID19 VACCINES IN OUR OFFICE! Ask the front desk for dates!     "Like" [phone] on Facebook and Instagram for our latest updates!      A healthy democracy works best when [place] participate! Make sure you are registered to vote! If you have moved or changed any of your contact information, you will need to get this updated before voting!  In some cases, you MAY be able to register to vote online: [employer]

## 2022-01-20 NOTE — Progress Notes (Signed)
NEW PATIENT  Date of Service/Encounter:  01/20/22  Consult requested by: Pcp, No   Assessment:   Moderate persistent asthma with acute exacerbation  Chronic rhinitis - deferred on skin testing given current clinical state  Nausea and vomiting - treating with PPI  Chronic cough since February - getting CXR out of an abundance of caution  Plan/Recommendations:   1. Moderate persistent asthma with acute exacerbation - Lung testing was in the low 70% range and improved slightly with the albuterol treatment. - We are starting you on a prednisone taper and we gave you a steroid injection to kick start the healing process. - Sample of Breztri provided (contains three medications to help keep inflammation in the lungs under control. - Spacer sample and demonstration provided. - We are also getting a chest X-ray to make sure that we are not dealing with a pneumonia.  - Daily controller medication(s): Breztri two puffs twice daily with spacer  - Prior to physical activity: albuterol 2 puffs 10-15 minutes before physical activity. - Rescue medications: albuterol 4 puffs every 4-6 hours as needed - Asthma control goals:  * Full participation in all desired activities (may need albuterol before activity) * Albuterol use two time or less a week on average (not counting use with activity) * Cough interfering with sleep two time or less a month * Oral steroids no more than once a year * No hospitalizations  2. Chronic rhinitis - Because of your difficulty breathing today, we are not going to do skin testing. - We will get some blood work instead. - Continue with cetirizine 10mg , but increase to TWICE DAILY for now. - Add on Flonase one spray per nostril daily.   3. Nausea and vomiting - A lot of this might be related to post-tussive emesis (due to the force of coughing), but we are going to start a proton pump inhibitor, if only temporarily, for treatment of an coexisting GERD.  - This  will not be a long term medication, I don't think. - Prednisone can flare up reflux, though, so it would be a good idea to be on it temporarily.   4. Return in about 6 weeks (around 03/03/2022).     This note in its entirety was forwarded to the Provider who requested this consultation.  Subjective:   Kyrstan Gotwalt is a 71 y.o. female presenting today for evaluation of  Chief Complaint  Patient presents with   Allergic Rhinitis     Cough,sneezing,runny nose, watery/itchy eyes,wheezing    Cough    Has had a cough since feb. Everytime she eats about 10 mins after she throws up. Feels tired most of the time. Mostly phlegm when coughing(white). Has a fever on and off.    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  Roniyah Llorens was referred by Pcp, No.     Chanteria is a 71 y.o. female presenting for an evaluation of a chronic cough as well as congestion and vomiting .   Asthma/Respiratory Symptom History: She has a cough since February 2023. This is a productive cough. She has been using cough drops. She has seen her PCP twice. They did an EKG which  was normal. She did not have a CXR at all.  She has had an albuterol inhaler for the last several years. Physical activity is certainly a trigger for her symptoms.   She has had intermittent fevers, up to 103. She has not been on antibiotics or prednisone for her cough. She has been using some antihistamines as well as nasal saline.   Allergic Rhinitis Symptom History: She has been congested for a while.  She had some seasonal allergies back in the spring. She reports sneezing and coughing. She has not been able to do any yard work. She does report some headaches that  do wake her up in the middle of the night. She reports that the headaches have been ongoing for a couple of weeks only. She has no history of migraines.   GERD Symptom History: She has vomiting that occurs daily after breakfast. This is thought to be related to the mucous production.  This is not consistent with one particular food at all. This is typically post tussive emesis. She has no history of reflux at all. The vomiting started in February as well.   She has a knee and hip replacement. She uses antibiotics prior to dental procedures. Otherwise she does not get antibiotics frequently at all.   Otherwise, there is no history of other atopic diseases, including drug allergies, stinging insect allergies, urticaria, or contact dermatitis. There is no significant infectious history. Vaccinations are up to date.    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 If you have any questions, ask your nurse or doctor.          albuterol 108 (90 Base) MCG/ACT inhaler Commonly known as: VENTOLIN HFA Inhale 1-2 puffs into the lungs every 6 (six) hours as needed for wheezing or shortness of breath.   amoxicillin 500 MG tablet Commonly known as: AMOXIL amoxicillin 500 mg tablet  Take 4 tablets by oral route 1 hour before dentist or other procedures   Breztri Aerosphere 160-9-4.8 MCG/ACT Aero Generic drug: Budeson-Glycopyrrol-Formoterol Inhale 2 puffs into the lungs in the morning and at bedtime. Started by: Alfonse Spruce, MD   cetirizine 10 MG tablet Commonly known as: ZYRTEC Take 10 mg by mouth as needed for allergies.   D-5000 125 MCG (5000 UT) Tabs Generic  drug: Cholecalciferol Take 1 tablet by mouth daily.   docusate sodium 100 MG capsule Commonly known as: COLACE Take 1 capsule (100 mg total) by mouth 2 (two) times daily. What changed:  when to take this reasons to take this   EYE ALLERGY RELIEF OP Apply 1 drop to eye 2 (two) times daily as needed (Eye allergies).   ferrous sulfate 325 (65 FE) MG tablet Take 1 tablet (325 mg total) by mouth 3 (three) times daily after meals.   fluticasone 50 MCG/ACT nasal spray Commonly known as: FLONASE Place 2 sprays into both nostrils daily. Started by: Alfonse Spruce, MD   HYDROcodone-acetaminophen 5-325 MG tablet Commonly known as: NORCO/VICODIN Take 1-2 tablets by mouth every 4 (four) hours as needed for moderate pain (pain score  4-6).   methocarbamol 500 MG tablet Commonly known as: ROBAXIN Take 1 tablet (500 mg total) by mouth every 6 (six) hours as needed for muscle spasms.   multivitamin with minerals Tabs tablet Take 1 tablet by mouth daily.   omeprazole 40 MG capsule Commonly known as: PRILOSEC Take 1 capsule (40 mg total) by mouth daily. Started by: Alfonse SpruceJoel Louis Ketsia Linebaugh, MD   polyethylene glycol 17 g packet Commonly known as: MIRALAX / GLYCOLAX Take 17 g by mouth 2 (two) times daily.        Birth History: non-contributory  Developmental History: non-contributory  Past Surgical History: Past Surgical History:  Procedure Laterality Date   ADENOIDECTOMY     APPENDECTOMY     KNEE ARTHROSCOPY     left   TONSILLECTOMY     TOTAL HIP ARTHROPLASTY Right 05/14/2018   Procedure: TOTAL HIP ARTHROPLASTY ANTERIOR APPROACH;  Surgeon: Durene Romanslin, Matthew, MD;  Location: [facility];  Service: Orthopedics;  Laterality: Right;   TOTAL KNEE ARTHROPLASTY Left 03/03/2015   Procedure: LEFT TOTAL KNEE ARTHROPLASTY;  Surgeon: Durene RomansMatthew Olin, MD;  Location: [facility];  Service: Orthopedics;  Laterality: Left;   TOTAL KNEE ARTHROPLASTY Right 11/18/2015   Procedure: RIGHT TOTAL KNEE  ARTHROPLASTY;  Surgeon: Durene RomansMatthew Olin, MD;  Location: [facility];  Service: Orthopedics;  Laterality: Right;   TUBAL LIGATION       Family History: Family History  Problem Relation Age of Onset   CAD Mother    Dementia Mother    Asthma Grandson    Asthma Daughter    Hypertension Neg Hx    Kidney Stones Neg Hx      Social History: Jaidah lives at home with her husband.  She lives in a house that is 69 years old.  There is carpeting throughout the home.  She has electric heating and heat pump for heating and cooling.  There are no indoor animals.  There is wildlife outside of the home.  There are dust mite covers on the bedding as well as pillows.  There is no tobacco exposure.  She currently is a retired Engineer, civil (consulting)nurse.  She is not exposed to fumes, chemicals, or dust.  She does have a HEPA filter in her house.   Review of Systems  Constitutional: Negative.  Negative for chills, fever, malaise/fatigue and weight loss.  HENT:  Positive for congestion and sinus pain. Negative for ear discharge and ear pain.   Eyes:  Negative for pain, discharge and redness.  Respiratory:  Positive for cough and sputum production. Negative for shortness of breath and wheezing.   Cardiovascular: Negative.  Negative for chest pain and palpitations.  Gastrointestinal:  Negative for abdominal pain, constipation, diarrhea, heartburn, nausea and vomiting.  Skin: Negative.  Negative for itching and rash.  Neurological:  Negative for dizziness and headaches.  Endo/Heme/Allergies:  Positive for environmental allergies. Does not bruise/bleed easily.       Objective:   Blood pressure 136/76, pulse 87, temperature (!) 97.3 F (36.3 C), resp. rate 20, height 5\' 1"  (1.549 m), weight 176 lb 4 oz (79.9 kg), SpO2 95 %. Body mass index is 33.3 kg/m.     Physical Exam Vitals reviewed.  Constitutional:      Appearance: She is well-developed.  HENT:     Head: Normocephalic and atraumatic.     Right Ear: Tympanic  membrane, ear canal and external ear normal. No drainage, swelling or tenderness. Tympanic membrane is not injected, scarred, erythematous, retracted or bulging.  Left Ear: Tympanic membrane, ear canal and external ear normal. No drainage, swelling or tenderness. Tympanic membrane is not injected, scarred, erythematous, retracted or bulging.     Nose: No nasal deformity, septal deviation, mucosal edema or rhinorrhea.     Right Turbinates: Enlarged and swollen.     Left Turbinates: Enlarged and swollen.     Right Sinus: No maxillary sinus tenderness or frontal sinus tenderness.     Left Sinus: No maxillary sinus tenderness or frontal sinus tenderness.     Comments: No nasal polyps.     Mouth/Throat:     Mouth: Mucous membranes are not pale and not dry.     Pharynx: Uvula midline.  Eyes:     General: Lids are normal. Allergic shiner present.        Right eye: No discharge.        Left eye: No discharge.     Conjunctiva/sclera: Conjunctivae normal.     Right eye: Right conjunctiva is not injected. No chemosis.    Left eye: Left conjunctiva is not injected. No chemosis.    Pupils: Pupils are equal, round, and reactive to light.  Cardiovascular:     Rate and Rhythm: Normal rate and regular rhythm.     Heart sounds: Normal heart sounds.  Pulmonary:     Effort: Pulmonary effort is normal. No tachypnea, accessory muscle usage or respiratory distress.     Breath sounds: Normal breath sounds. No wheezing, rhonchi or rales.     Comments: Productive cough during the visit. Speaking in full sentences.  Chest:     Chest wall: No tenderness.  Abdominal:     Tenderness: There is no abdominal tenderness. There is no guarding or rebound.  Lymphadenopathy:     Head:     Right side of head: No submandibular, tonsillar or occipital adenopathy.     Left side of head: No submandibular, tonsillar or occipital adenopathy.     Cervical: No cervical adenopathy.  Skin:    Coloration: Skin is not pale.      Findings: No abrasion, erythema, petechiae or rash. Rash is not papular, urticarial or vesicular.  Neurological:     Mental Status: She is alert.  Psychiatric:        Behavior: Behavior is cooperative.      Diagnostic studies:   Spirometry: results abnormal (FEV1: 1.46/72%, FVC: 1.86/72%, FEV1/FVC: 78%).    Spirometry consistent with possible restrictive disease. Xopenex four puffs via MDI treatment given in clinic with improvement in FEV1 and FVC, but not significant per ATS criteria.  Allergy Studies: labs sent instead   We did order a chest x-ray which looked normal from my reading.  Costophrenic angles were visualized.  Heart border was excellent.  Negative spine sign.  We will follow-up on the official read.           Malachi Bonds, MD [facility]

## 2022-01-22 ENCOUNTER — Telehealth: Payer: Self-pay

## 2022-01-22 NOTE — Telephone Encounter (Signed)
Called patient back - DOB verified - rescheduled December appt - F/U is in 6 wks not 6 mths.  Appt sched for 03/31/22 @ 3:15 pm w/Dr. Dellis Anes.  Patient verbalized understanding, no further questions.

## 2022-01-24 LAB — ALLERGENS W/COMP RFLX AREA 2

## 2022-01-24 LAB — CBC WITH DIFFERENTIAL/PLATELET
Basos: 1 %
EOS (ABSOLUTE): 0.3 10*3/uL (ref 0.0–0.4)
Hemoglobin: 12.9 g/dL (ref 11.1–15.9)
Lymphs: 23 %
MCHC: 34.4 g/dL (ref 31.5–35.7)
MCV: 85 fL (ref 79–97)
Monocytes Absolute: 0.8 10*3/uL (ref 0.1–0.9)
Monocytes: 6 %
Neutrophils: 67 %
RBC: 4.41 x10E6/uL (ref 3.77–5.28)
WBC: 13.1 10*3/uL — ABNORMAL HIGH (ref 3.4–10.8)

## 2022-01-24 LAB — ASPERGILLUS PRECIPITINS

## 2022-01-24 LAB — ANCA TITERS: Atypical pANCA: <1:20 {titer}

## 2022-01-24 LAB — ALLERGEN PROFILE, MOLD
M009-IgE Fusarium proliferatum: <0.1 kU/L
Phoma Betae IgE: <0.1 kU/L
Stemphylium Herbarum IgE: <0.1 kU/L

## 2022-01-25 LAB — ALLERGEN PROFILE, MOLD
Aureobasidi Pullulans IgE: <0.1 kU/L
Candida Albicans IgE: <0.1 kU/L
M014-IgE Epicoccum purpur: <0.1 kU/L
Mucor Racemosus IgE: <0.1 kU/L
Setomelanomma Rostrat: <0.1 kU/L

## 2022-01-25 LAB — CBC WITH DIFFERENTIAL/PLATELET
Basophils Absolute: 0.1 10*3/uL (ref 0.0–0.2)
Eos: 2 %
Hematocrit: 37.5 % (ref 34.0–46.6)
Immature Grans (Abs): 0.1 10*3/uL (ref 0.0–0.1)
Immature Granulocytes: 1 %
Lymphocytes Absolute: 3 10*3/uL (ref 0.7–3.1)
MCH: 29.3 pg (ref 26.6–33.0)
Neutrophils Absolute: 8.8 10*3/uL — ABNORMAL HIGH (ref 1.4–7.0)
Platelets: 636 10*3/uL — ABNORMAL HIGH (ref 150–450)
RDW: 11.8 % (ref 11.7–15.4)

## 2022-01-25 LAB — ALLERGENS W/COMP RFLX AREA 2
Alternaria Alternata IgE: <0.1 kU/L
Aspergillus Fumigatus IgE: <0.1 kU/L
Cladosporium Herbarum IgE: <0.1 kU/L
Common Silver Birch IgE: 1.63 kU/L — AB
Cottonwood IgE: <0.1 kU/L
E005-IgE Dog Dander: 0.12 kU/L — AB
Elm, American IgE: <0.1 kU/L
Johnson Grass IgE: <0.1 kU/L
Mouse Urine IgE: <0.1 kU/L
Oak, White IgE: 0.74 kU/L — AB
Pecan, Hickory IgE: 1.19 kU/L — AB
Ragweed, Short IgE: <0.1 kU/L
Sheep Sorrel IgE Qn: <0.1 kU/L
White Mulberry IgE: <0.1 kU/L

## 2022-01-25 LAB — ALPHA-1-ANTITRYPSIN: A-1 Antitrypsin: 261 mg/dL — ABNORMAL HIGH (ref 101–187)

## 2022-01-25 LAB — PANEL 606578
E094-IgE Fel d 1: 1.04 kU/L — AB
E220-IgE Fel d 2: <0.1 kU/L
E228-IgE Fel d 4: <0.1 kU/L

## 2022-01-25 LAB — ASPERGILLUS PRECIPITINS
Aspergillus Niger Antibodies: NEGATIVE
Aspergillus glaucus IgG: NEGATIVE
Aspergillus terreus IgG: NEGATIVE

## 2022-01-25 LAB — ALLERGEN COMPONENT COMMENTS

## 2022-01-25 LAB — ANCA TITERS
C-ANCA: <1:20 {titer}
P-ANCA: <1:20 {titer}

## 2022-01-29 ENCOUNTER — Telehealth: Payer: Self-pay

## 2022-02-01 ENCOUNTER — Other Ambulatory Visit: Payer: Self-pay | Admitting: *Deleted

## 2022-02-01 MED ORDER — BREZTRI AEROSPHERE 160-9-4.8 MCG/ACT IN AERO: 2.0000 | INHALATION_SPRAY | Freq: Two times a day (BID) | RESPIRATORY_TRACT | 3 refills | Status: AC

## 2022-02-04 NOTE — Telephone Encounter (Signed)
Spoke to patient to let her know that Dr. Dellis Anes advised that since she is doing so well with the [facility] that she does not need to see GI at present.

## 2022-02-04 NOTE — Telephone Encounter (Signed)
If she is doing bette on the [place], no need to see GI. Glad it is working!   Malachi Bonds, MD [facility]

## 2022-03-31 ENCOUNTER — Encounter: Payer: Self-pay | Admitting: Allergy & Immunology

## 2022-03-31 ENCOUNTER — Ambulatory Visit (INDEPENDENT_AMBULATORY_CARE_PROVIDER_SITE_OTHER): Payer: Medicare Other | Admitting: Allergy & Immunology

## 2022-03-31 VITALS — HR 99 | Temp 99.3°F | Resp 16

## 2022-03-31 DIAGNOSIS — J3089 Other allergic rhinitis: Secondary | ICD-10-CM

## 2022-03-31 DIAGNOSIS — J4540 Moderate persistent asthma, uncomplicated: Secondary | ICD-10-CM

## 2022-03-31 DIAGNOSIS — J302 Other seasonal allergic rhinitis: Secondary | ICD-10-CM

## 2022-03-31 NOTE — Progress Notes (Signed)
FOLLOW UP  Date of Service/Encounter:  03/31/22   Assessment:   Moderate persistent asthma - with much better symptom control and spirometry today   Chronic rhinitis (cat, dog, dust mites, trees)   Normal recent CXR  Plan/Recommendations:   1. Moderate persistent asthma, uncomplicated  - Lung testing looked MUCH better today with 82%!!!  - I think we are on the right track!  - We can keep Fasenra on the back burner to have in case symptoms worsen. - Daily controller medication(s): Breztri two puffs twice daily with spacer  - Prior to physical activity: albuterol 2 puffs 10-15 minutes before physical activity. - Rescue medications: albuterol 4 puffs every 4-6 hours as needed - Asthma control goals:  * Full participation in all desired activities (may need albuterol before activity) * Albuterol use two time or less a week on average (not counting use with activity) * Cough interfering with sleep two time or less a month * Oral steroids no more than once a year * No hospitalizations  2. Chronic rhinitis (cat, dog, dust mites, trees) - I do not think that allergy shots would be helpful since you do not have cats or dogs. - Dust mite avoidance measures provided. - Continue with cetirizine 10mg  up to twice daily. - Continue with Flonase one spray per nostril daily.   3. Return in about 6 months (around 10/01/2022).    Subjective:   Darlene Petty is a 71 y.o. female presenting today for follow up of  Chief Complaint  Patient presents with   Asthma    Asthma is doing better since her last visit. Has some spells   Cough    Has been having some dry cough and coughs up white frothy phlegm.     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  Darlene Petty is a 71 y.o. female presenting for a follow up visit.  Last saw her in June 2023.  At that time, lung testing was in the low 70% range but slightly improved with albuterol. We gave her prednisone taper gave her sample of Breztri to use 2 puffs twice daily as needed.  We did not do any skin testing at that time because she was worried and sore debris.  We will continue with cetirizine 10 mg but increase it to twice daily.  We added on Flonase 1 spray per nostril daily.  She was a lot of nausea and vomiting, and we thought this was due to the mucus production as well as posttussive emesis.  We started her on a PPI for management of coexisting reflux which we felt might have been exacerbated by her prednisone use.  She did have an absolute eosinophil count of 300 and we decided to start her on Fasenra.  Environmental allergy panel was positive to dust mites, cat, dog, and trees.  Alpha anti-Ro trypsin was not normal.  Since last visit, she has done remarkably well.  Asthma/Respiratory Symptom History: She remains on the [place]. She is getting free drug through the end of December. That is going to save her $800 a month. She is overall doing much better, but she is having coughing episodes where she coughing  up phlegm.  She has not coughed once during the visit, however.  We did talk about adding injectable medications to help with her breathing, but she wants to just continue the course.  Allergic Rhinitis Symptom History: They did ask about allergy shots today, however she lives about 40-45 minutes away.  She did ask about injecting them at home since she is a retired Engineer, civil (consulting), but we typically do not do that.  Reviewing her sensitizations, she is sensitized to cats, dogs, dust mites, and trees.  She does not have a cat or dog or so but is rarely around them at all.  Therefore, I am not sure how relevant these are to her clinical presentation.  She has not  instituted dust mite amelioration, but she will go ahead and do that to see how this helps.  She remains on the cetirizine and the Flonase.  Otherwise, there have been no changes to her past medical history, surgical history, family history, or social history.    Review of Systems  Constitutional: Negative.  Negative for chills, fever, malaise/fatigue and weight loss.  HENT: Negative.  Negative for congestion, ear discharge, ear pain and sinus pain.   Eyes:  Negative for pain, discharge and redness.  Respiratory:  Negative for cough, sputum production, shortness of breath and wheezing.   Cardiovascular: Negative.  Negative for chest pain and palpitations.  Gastrointestinal:  Negative for abdominal pain, constipation, diarrhea, heartburn, nausea and vomiting.  Skin: Negative.  Negative for itching and rash.  Neurological:  Negative for dizziness and headaches.  Endo/Heme/Allergies:  Negative for environmental allergies. Does not bruise/bleed easily.       Objective:   Pulse 99, temperature 99.3 F (37.4 C), resp. rate 16, SpO2 94 %. There is no height or weight on file to calculate BMI.    Physical Exam Vitals reviewed.  Constitutional:      Appearance: She is well-developed.  HENT:     Head: Normocephalic and atraumatic.     Right Ear: Tympanic membrane, ear canal and external ear normal.     Left Ear: Tympanic membrane, ear canal and external ear normal.     Nose: No nasal deformity, septal deviation, mucosal edema or rhinorrhea.     Right Turbinates: Enlarged, swollen and pale.     Left Turbinates: Enlarged, swollen and pale.     Right Sinus: No maxillary sinus tenderness or frontal sinus tenderness.     Left Sinus: No maxillary sinus tenderness or frontal sinus tenderness.     Mouth/Throat:     Mouth: Mucous membranes are not pale and not dry.     Pharynx: Uvula midline.  Eyes:     General: Lids are normal. Allergic shiner present.        Right eye: No discharge.         Left eye: No discharge.     Conjunctiva/sclera: Conjunctivae normal.     Right eye: Right conjunctiva is not injected. No chemosis.    Left eye: Left conjunctiva is not injected. No chemosis.    Pupils: Pupils are equal, round, and reactive to light.  Cardiovascular:     Rate and Rhythm: Normal rate and regular rhythm.     Heart sounds: Normal heart sounds.  Pulmonary:     Effort: Pulmonary effort is normal. No tachypnea, accessory muscle usage or respiratory distress.     Breath sounds: Normal breath sounds. No wheezing, rhonchi or rales.     Comments: Moving air well in all lung  fields.  No increased work of breathing. Chest:     Chest wall: No tenderness.  Lymphadenopathy:     Cervical: No cervical adenopathy.  Skin:    General: Skin is warm.     Capillary Refill: Capillary refill takes less than 2 seconds.     Coloration: Skin is not pale.     Findings: No abrasion, erythema, petechiae or rash. Rash is not papular, urticarial or vesicular.     Comments: No eczematous or urticarial lesions noted.  Neurological:     Mental Status: She is alert.  Psychiatric:        Behavior: Behavior is cooperative.      Diagnostic studies:  Spirometry:         Allergy Studies: none        Malachi Bonds, MD  [facility]

## 2022-03-31 NOTE — Patient Instructions (Addendum)
1. Moderate persistent asthma, uncomplicated  - Lung testing looked MUCH better today with 82%!!!  - I think we are on the right track!  - We can keep Fasenra on the back burner to have in case symptoms worsen. - Daily controller medication(s): Breztri two puffs twice daily with spacer  - Prior to physical activity: albuterol 2 puffs 10-15 minutes before physical activity. - Rescue medications: albuterol 4 puffs every 4-6 hours as needed - Asthma control goals:  * Full participation in all desired activities (may need albuterol before activity) * Albuterol use two time or less a week on average (not counting use with activity) * Cough interfering with sleep two time or less a month * Oral steroids no more than once a year * No hospitalizations  2. Chronic rhinitis (cat, dog, dust mites, trees) - I do not think that allergy shots would be helpful since you do not have cats or dogs. - Dust mite avoidance measures provided. - Continue with cetirizine 10mg  up to twice daily. - Continue with Flonase one spray per nostril daily.   3. Return in about 6 months (around 10/01/2022).    Please inform 10/03/2022 of any Emergency Department visits, hospitalizations, or changes in symptoms. Call us before going to the ED for breathing or allergy symptoms since we might be able to fit you in for a sick visit. Feel free to contact us anytime with any questions, problems, or concerns.  It was a pleasure to see you and your family again today!  Websites that have reliable patient information: 1. American Academy of Asthma, Allergy, and Immunology: www.aaaai.org 2. Food Allergy Research and Education (FARE): foodallergy.org 3. Mothers of Asthmatics: http://www.asthmacommunitynetwork.org 4. American College of Allergy, Asthma, and Immunology: www.acaai.org   COVID-19 Vaccine Information can be found at: [place] For questions related to  vaccine distribution or appointments, please email [email or web address] or call [phone].   We realize that you might be concerned about having an allergic reaction to the COVID19 vaccines. To help with that concern, WE ARE OFFERING THE COVID19 VACCINES IN OUR OFFICE! Ask the front desk for dates!     "Like" [phone] on Facebook and Instagram for our latest updates!      A healthy democracy works best when [place] participate! Make sure you are registered to vote! If you have moved or changed any of your contact information, you will need to get this updated before voting!  In some cases, you MAY be able to register to vote online: [employer]    Reducing Pollen Exposure  The American Academy of Allergy, Asthma and Immunology suggests the following steps to reduce your exposure to pollen during allergy seasons.    Do not hang sheets or clothing out to dry; pollen may collect on these items. Do not mow lawns or spend time around freshly cut grass; mowing stirs up pollen. Keep windows closed at night.  Keep car windows closed while driving. Minimize morning activities outdoors, a time when pollen counts are usually at their highest. Stay indoors as much as possible when pollen counts or humidity is high and on windy days when pollen tends to remain in the air longer. Use air conditioning when possible.  Many air conditioners have filters that trap the pollen spores. Use a HEPA room air filter to remove pollen form the indoor air you breathe.  Control of Dog or Cat Allergen  Avoidance is the best way to manage a dog or cat allergy. If you have a dog  or cat and are allergic to dog or cats, consider removing the dog or cat from the home. If you have a dog or cat but don't want to find it a new home, or if your family wants a pet even though someone in the household is allergic, here are some strategies that may help keep symptoms at bay:  Keep the  pet out of your bedroom and restrict it to only a few rooms. Be advised that keeping the dog or cat in only one room will not limit the allergens to that room. Don't pet, hug or kiss the dog or cat; if you do, wash your hands with soap and water. High-efficiency particulate air (HEPA) cleaners run continuously in a bedroom or living room can reduce allergen levels over time. Regular use of a high-efficiency vacuum cleaner or a central vacuum can reduce allergen levels. Giving your dog or cat a bath at least once a week can reduce airborne allergen.  Control of Dust Mite Allergen    Dust mites play a major role in allergic asthma and rhinitis.  They occur in environments with high humidity wherever human skin is found.  Dust mites absorb humidity from the atmosphere (ie, they do not drink) and feed on organic matter (including shed human and animal skin).  Dust mites are a microscopic type of insect that you cannot see with the naked eye.  High levels of dust mites have been detected from mattresses, pillows, carpets, upholstered furniture, bed covers, clothes, soft toys and any woven material.  The principal allergen of the dust mite is found in its feces.  A gram of dust may contain 1,000 mites and 250,000 fecal particles.  Mite antigen is easily measured in the air during house cleaning activities.  Dust mites do not bite and do not cause harm to humans, other than by triggering allergies/asthma.    Ways to decrease your exposure to dust mites in your home:  Encase mattresses, box springs and pillows with a mite-impermeable barrier or cover   Wash sheets, blankets and drapes weekly in hot water (130 F) with detergent and dry them in a dryer on the hot setting.  Have the room cleaned frequently with a vacuum cleaner and a damp dust-mop.  For carpeting or rugs, vacuuming with a vacuum cleaner equipped with a high-efficiency particulate air (HEPA) filter.  The dust mite allergic individual should not  be in a room which is being cleaned and should wait 1 hour after cleaning before going into the room. Do not sleep on upholstered furniture (eg, couches).   If possible removing carpeting, upholstered furniture and drapery from the home is ideal.  Horizontal blinds should be eliminated in the rooms where the person spends the most time (bedroom, study, television room).  Washable vinyl, roller-type shades are optimal. Remove all non-washable stuffed toys from the bedroom.  Wash stuffed toys weekly like sheets and blankets above.   Reduce indoor humidity to less than 50%.  Inexpensive humidity monitors can be purchased at most hardware stores.  Do not use a humidifier as can make the problem worse and are not recommended.

## 2022-04-02 ENCOUNTER — Encounter: Payer: Self-pay | Admitting: Allergy & Immunology

## 2022-04-02 MED ORDER — ALBUTEROL SULFATE HFA 108 (90 BASE) MCG/ACT IN AERS: 2.0000 | INHALATION_SPRAY | RESPIRATORY_TRACT | 1 refills | Status: DC | PRN

## 2022-07-23 ENCOUNTER — Ambulatory Visit (INDEPENDENT_AMBULATORY_CARE_PROVIDER_SITE_OTHER): Payer: Medicare Other | Admitting: Allergy & Immunology

## 2022-07-23 ENCOUNTER — Telehealth: Payer: Self-pay

## 2022-07-23 ENCOUNTER — Ambulatory Visit: Payer: Medicare Other | Admitting: Allergy & Immunology

## 2022-07-23 ENCOUNTER — Encounter: Payer: Self-pay | Admitting: Allergy & Immunology

## 2022-07-23 VITALS — BP 118/68 | HR 99 | Temp 99.4°F | Resp 18 | Ht 62.0 in | Wt 182.2 lb

## 2022-07-23 DIAGNOSIS — J3089 Other allergic rhinitis: Secondary | ICD-10-CM

## 2022-07-23 DIAGNOSIS — E559 Vitamin D deficiency, unspecified: Secondary | ICD-10-CM

## 2022-07-23 DIAGNOSIS — J302 Other seasonal allergic rhinitis: Secondary | ICD-10-CM | POA: Diagnosis not present

## 2022-07-23 DIAGNOSIS — J4540 Moderate persistent asthma, uncomplicated: Secondary | ICD-10-CM | POA: Diagnosis not present

## 2022-07-23 MED ORDER — SPACER/AERO-HOLDING CHAMBERS DEVI: 1.0000 | 1 refills | Status: AC

## 2022-07-23 MED ORDER — BREZTRI AEROSPHERE 160-9-4.8 MCG/ACT IN AERO: 2.0000 | INHALATION_SPRAY | Freq: Two times a day (BID) | RESPIRATORY_TRACT | 5 refills | Status: AC

## 2022-07-23 MED ORDER — D-5000 125 MCG (5000 UT) PO TABS: 1.0000 | ORAL_TABLET | Freq: Every day | ORAL | 1 refills | Status: AC

## 2022-07-23 MED ORDER — ALBUTEROL SULFATE HFA 108 (90 BASE) MCG/ACT IN AERS: 2.0000 | INHALATION_SPRAY | RESPIRATORY_TRACT | 1 refills | Status: AC | PRN

## 2022-07-23 MED ORDER — CETIRIZINE HCL 10 MG PO TABS: 10.0000 mg | ORAL_TABLET | Freq: Every day | ORAL | 5 refills | Status: AC | PRN

## 2022-07-23 NOTE — Telephone Encounter (Signed)
Per pa team PA not submitted due to preferred alternatives ADVAIR DISKUS, ANORO ELLIPTA, BREO ELLIPTA, INCRUSE ELLIPTA, STRIVERDI RESPIMAT, TRELEGY ELLIPTA. Please advise.

## 2022-07-23 NOTE — Telephone Encounter (Signed)
Lm explaing this to pt and to call us back if she needs anything

## 2022-07-23 NOTE — Progress Notes (Signed)
FOLLOW UP  Date of Service/Encounter:  07/23/22   Assessment:   Moderate persistent asthma - with much better symptom control and spirometry today   Chronic rhinitis (cat, dog, dust mites, trees)   Normal recent CXR  Plan/Recommendations:   1. Moderate persistent asthma, uncomplicated  - Lung testing looked very stable.  - We can keep Fasenra on the back burner to have in case symptoms worsen - We are in a good spot right now!  - Daily controller medication(s): NOTHING  - Prior to physical activity: albuterol 2 puffs 10-15 minutes before physical activity. - Rescue medications: albuterol 4 puffs every 4-6 hours as needed - Changes during respiratory infections or worsening symptoms: Add on Breztri two puffs to 2 puffs twice daily for TWO WEEKS. - Asthma control goals:  * Full participation in all desired activities (may need albuterol before activity) * Albuterol use two time or less a week on average (not counting use with activity) * Cough interfering with sleep two time or less a month * Oral steroids no more than once a year * No hospitalizations  2. Chronic rhinitis (cat, dog, dust mites, trees) - I do not think that allergy shots would be helpful since you do not have cats or dogs. - Continue with cetirizine 10mg  up to twice daily. - Continue with Flonase one spray per nostril daily.  - Getting the Air Purifier was definitely a good idea!   3. Return in about 6 months (around 01/22/2023).   Subjective:   Darlene Petty is a 71 y.o. female presenting today for follow up of  Chief Complaint  Patient presents with   Asthma    States no issues. 80 is doing well.   Allergic Rhinitis     Says she is well. No issues or concerns at this time.     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  Darlene Petty is a 71 y.o. female presenting for a follow up visit. She was last seen in August 2023.  At that time, lung testing looked much better.  We had discussed adding on Fasenra to help the [place] worked better, but her spirometry has improved so much, therefore we held off.  We continue with Breztri 2 puffs twice daily.  For her rhinitis, we continued with cetirizine and Flonase.  Since last visit, she has done very well.  Asthma/Respiratory Symptom History: She is down to nothing on the [place].  She has not had any coughing episodes since doing this.  She started doing the side effects of the [facility] and became concerned, which is why she stopped using her [facility] over the course of a month or so.  She has not been on prednisone at all since last time I saw her.  Her coughing has never returned.  Allergic Rhinitis Symptom History: She has been wearing a mask when she makes a believes.  This is helped to provide any dust that might have caused coughing in the past.  She has not been using cetirizine and Flonase on a routine basis.   He did not have any bleeding Christmas plan.  She did bring her husband with her today.  They have anything doing any needs  we will plan including a trip to [place] and lunch at the [employer]. She has her Christmas decorations out; she put them up right after Thanksgiving.   Otherwise, there have been no changes to her past medical history, surgical history, family history, or social history.    Review of Systems  Constitutional: Negative.  Negative for chills, fever, malaise/fatigue and weight loss.  HENT: Negative.  Negative for congestion, ear discharge, ear pain and sinus pain.   Eyes:  Negative for pain, discharge and redness.  Respiratory:  Negative for cough, sputum production, shortness of breath and wheezing.   Cardiovascular: Negative.  Negative for chest pain  and palpitations.  Gastrointestinal:  Negative for abdominal pain, constipation, diarrhea, heartburn, nausea and vomiting.  Skin: Negative.  Negative for itching and rash.  Neurological:  Negative for dizziness and headaches.  Endo/Heme/Allergies:  Positive for environmental allergies. Does not bruise/bleed easily.       Objective:   Blood pressure 118/68, pulse 99, temperature 99.4 F (37.4 C), temperature source Temporal, resp. rate 18, height 5\' 2"  (1.575 m), weight 182 lb 3.2 oz (82.6 kg), SpO2 95 %. Body mass index is 33.32 kg/m.    Physical Exam Vitals reviewed.  Constitutional:      Appearance: She is well-developed.     Comments: Very lovely and talkative.   HENT:     Head: Normocephalic and atraumatic.     Right Ear: Tympanic membrane, ear canal and external ear normal.     Left Ear: Tympanic membrane, ear canal and external ear normal.     Nose: No nasal deformity, septal deviation, mucosal edema or rhinorrhea.     Right Turbinates: Enlarged, swollen and pale.     Left Turbinates: Enlarged, swollen and pale.     Right Sinus: No maxillary sinus tenderness or frontal sinus tenderness.     Left Sinus: No maxillary sinus tenderness or frontal sinus tenderness.     Mouth/Throat:     Mouth: Mucous membranes are not pale and not dry.     Pharynx: Uvula midline.  Eyes:     General: Lids are normal. Allergic shiner present.        Right eye: No discharge.        Left eye: No discharge.     Conjunctiva/sclera: Conjunctivae normal.     Right eye: Right conjunctiva is not injected. No chemosis.    Left eye: Left conjunctiva is not injected. No chemosis.    Pupils: Pupils are equal, round, and reactive to light.  Cardiovascular:     Rate and Rhythm: Normal rate and regular rhythm.     Heart sounds: Normal heart sounds.  Pulmonary:     Effort: Pulmonary effort is normal. No tachypnea, accessory muscle usage or respiratory distress.     Breath sounds: Normal breath sounds.  No wheezing, rhonchi or rales.     Comments: Moving air well in all lung fields.  No increased work of breathing. Chest:     Chest wall: No tenderness.  Lymphadenopathy:     Cervical: No cervical adenopathy.  Skin:    General: Skin is warm.     Capillary Refill: Capillary refill takes less than 2 seconds.     Coloration: Skin is not pale.     Findings: No abrasion, erythema, petechiae or rash. Rash is not papular, urticarial or vesicular.     Comments: No eczematous or urticarial lesions noted.  Neurological:     Mental Status: She is alert.  Psychiatric:  Behavior: Behavior is cooperative.      Diagnostic studies:    Spirometry: results normal (FEV1: 1.52/73%, FVC: 2.13/80%, FEV1/FVC: 71%).    Spirometry consistent with normal pattern.   Allergy Studies: none      Malachi Bonds, MD  [facility]

## 2022-07-23 NOTE — Telephone Encounter (Signed)
She is using PRN at this time. Let's just not send anything in. Patient has extra at home anyway. Please call to let her know.   Malachi Bonds, MD [facility]

## 2022-07-23 NOTE — Patient Instructions (Addendum)
1. Moderate persistent asthma, uncomplicated  - Lung testing looked very stable.  - We can keep Fasenra on the back burner to have in case symptoms worsen - We are in a good spot right now!  - Daily controller medication(s): NOTHING  - Prior to physical activity: albuterol 2 puffs 10-15 minutes before physical activity. - Rescue medications: albuterol 4 puffs every 4-6 hours as needed - Changes during respiratory infections or worsening symptoms: Add on Breztri two puffs to 2 puffs twice daily for TWO WEEKS. - Asthma control goals:  * Full participation in all desired activities (may need albuterol before activity) * Albuterol use two time or less a week on average (not counting use with activity) * Cough interfering with sleep two time or less a month * Oral steroids no more than once a year * No hospitalizations  2. Chronic rhinitis (cat, dog, dust mites, trees) - I do not think that allergy shots would be helpful since you do not have cats or dogs. - Continue with cetirizine 10mg  up to twice daily. - Continue with Flonase one spray per nostril daily.  - Getting the Air Purifier was definitely a good idea!   3. Return in about 6 months (around 01/22/2023).    Please inform 01/24/2023 of any Emergency Department visits, hospitalizations, or changes in symptoms. Call us before going to the ED for breathing or allergy symptoms since we might be able to fit you in for a sick visit. Feel free to contact us anytime with any questions, problems, or concerns.  It was a pleasure to see you and your family again today!  [place] is the Shelba Flake! Send her an email with staff kudos!   [email or web address]  Websites that have reliable patient information: 1. American Academy of Asthma, Allergy, and Immunology: www.aaaai.org 2. Food Allergy Research and Education (FARE): foodallergy.org 3. Mothers of Asthmatics: http://www.asthmacommunitynetwork.org 4. American College of  Allergy, Asthma, and Immunology: www.acaai.org   COVID-19 Vaccine Information can be found at: Engineer, manufacturing For questions related to vaccine distribution or appointments, please email [email or web address] or call [phone].   We realize that you might be concerned about having an allergic reaction to the COVID19 vaccines. To help with that concern, WE ARE OFFERING THE COVID19 VACCINES IN OUR OFFICE! Ask the front desk for dates!     "Like" [phone] on Facebook and Instagram for our latest updates!      A healthy democracy works best when [place] participate! Make sure you are registered to vote! If you have moved or changed any of your contact information, you will need to get this updated before voting!  In some cases, you MAY be able to register to vote online: [employer]

## 2022-07-23 NOTE — Telephone Encounter (Signed)
PA request received via CMM through [employer] for Breztri Aerosphere 160-9-4.8MCG/ACT aerosol  Key: [id number]  PA not submitted due to preferred alternatives ADVAIR DISKUS, ANORO ELLIPTA, BREO ELLIPTA, INCRUSE ELLIPTA, STRIVERDI RESPIMAT, TRELEGY ELLIPTA.   Please advise if patient has previously try/failed these alternatives.

## 2022-09-29 ENCOUNTER — Ambulatory Visit: Payer: Medicare Other | Admitting: Allergy & Immunology

## 2023-01-19 ENCOUNTER — Ambulatory Visit: Payer: Medicare Other | Admitting: Allergy & Immunology

## 2024-10-16 ENCOUNTER — Ambulatory Visit (HOSPITAL_COMMUNITY): Admit: 2024-10-16 | Payer: PRIVATE HEALTH INSURANCE | Admitting: Orthopedic Surgery

## 2024-10-16 DIAGNOSIS — M1612 Unilateral primary osteoarthritis, left hip: Secondary | ICD-10-CM

## 2024-10-16 SURGERY — ARTHROPLASTY, HIP, TOTAL, ANTERIOR APPROACH
Anesthesia: Spinal | Site: Hip | Laterality: Left
